# Patient Record
Sex: Male | Born: 1970 | Race: White | Hispanic: No | Marital: Single | Smoking: Never smoker
Health system: Southern US, Community
[De-identification: ages and names within clinical notes are randomized; demographics above are authoritative.]

## PROBLEM LIST (undated history)

## (undated) ENCOUNTER — Ambulatory Visit: Payer: BC Managed Care – PPO | Source: Home / Self Care

## (undated) DIAGNOSIS — Z87442 Personal history of urinary calculi: Secondary | ICD-10-CM

## (undated) DIAGNOSIS — M479 Spondylosis, unspecified: Secondary | ICD-10-CM

## (undated) HISTORY — PX: MOUTH SURGERY: SHX715

## (undated) HISTORY — PX: FRACTURE SURGERY: SHX138

## (undated) HISTORY — PX: LASIK: SHX215

## (undated) HISTORY — PX: WRIST SURGERY: SHX841

## (undated) HISTORY — PX: FOOT OSTEOTOMY: SHX957

---

## 2000-09-21 ENCOUNTER — Encounter: Payer: Self-pay | Admitting: Family Medicine

## 2000-09-21 ENCOUNTER — Encounter: Admission: RE | Admit: 2000-09-21 | Discharge: 2000-09-21 | Payer: Self-pay | Admitting: Family Medicine

## 2004-04-26 ENCOUNTER — Ambulatory Visit (HOSPITAL_COMMUNITY): Admission: RE | Admit: 2004-04-26 | Discharge: 2004-04-26 | Payer: Self-pay | Admitting: Rheumatology

## 2005-03-07 ENCOUNTER — Ambulatory Visit (HOSPITAL_COMMUNITY): Admission: RE | Admit: 2005-03-07 | Discharge: 2005-03-07 | Payer: Self-pay | Admitting: Orthopedic Surgery

## 2005-03-07 ENCOUNTER — Ambulatory Visit (HOSPITAL_BASED_OUTPATIENT_CLINIC_OR_DEPARTMENT_OTHER): Admission: RE | Admit: 2005-03-07 | Discharge: 2005-03-07 | Payer: Self-pay | Admitting: Orthopedic Surgery

## 2006-02-03 ENCOUNTER — Emergency Department (HOSPITAL_COMMUNITY): Admission: EM | Admit: 2006-02-03 | Discharge: 2006-02-03 | Payer: Self-pay | Admitting: *Deleted

## 2006-02-05 ENCOUNTER — Encounter: Admission: RE | Admit: 2006-02-05 | Discharge: 2006-02-05 | Payer: Self-pay | Admitting: Specialist

## 2006-02-08 ENCOUNTER — Ambulatory Visit (HOSPITAL_COMMUNITY): Admission: RE | Admit: 2006-02-08 | Discharge: 2006-02-09 | Payer: Self-pay | Admitting: Specialist

## 2006-07-13 ENCOUNTER — Encounter: Admission: RE | Admit: 2006-07-13 | Discharge: 2006-07-13 | Payer: Self-pay | Admitting: Family Medicine

## 2012-03-19 ENCOUNTER — Other Ambulatory Visit (HOSPITAL_COMMUNITY): Payer: Self-pay | Admitting: Rheumatology

## 2012-03-19 ENCOUNTER — Ambulatory Visit (HOSPITAL_COMMUNITY)
Admission: RE | Admit: 2012-03-19 | Discharge: 2012-03-19 | Disposition: A | Discharge status: Home / Self Care | Payer: Managed Care, Other (non HMO) | Source: Ambulatory Visit | Attending: Rheumatology | Admitting: Rheumatology

## 2012-03-19 DIAGNOSIS — R05 Cough: Secondary | ICD-10-CM | POA: Insufficient documentation

## 2012-03-19 DIAGNOSIS — R0602 Shortness of breath: Secondary | ICD-10-CM | POA: Insufficient documentation

## 2012-03-19 DIAGNOSIS — R059 Cough, unspecified: Secondary | ICD-10-CM | POA: Insufficient documentation

## 2012-03-19 DIAGNOSIS — R52 Pain, unspecified: Secondary | ICD-10-CM

## 2013-12-19 ENCOUNTER — Other Ambulatory Visit (HOSPITAL_COMMUNITY): Payer: Self-pay | Admitting: Orthopaedic Surgery

## 2014-01-02 NOTE — Pre-Procedure Instructions (Addendum)
Edwin BowensJason K Gilbert  01/02/2014   Your procedure is scheduled on:  01/12/14  [facility] at 530 AM.  Call this number if you have problems the morning of surgery: [phone]   Remember:   Do not eat food or drink liquids after midnight.   Take these medicines the morning of surgery with A SIP OF WATER: none      .STOP all herbel meds, nsaids (aleve,naproxen,advil,ibuprofen) 5 days prior to surgery including vitamins, aspirin   Do not wear jewelry, make-up or nail polish.  Do not wear lotions, powders, or perfumes. You may wear deodorant.  Do not shave 48 hours prior to surgery. Men may shave face and neck.  Do not bring valuables to the hospital.  [facility] not responsible                  for any belongings or valuables.               Contacts, dentures or bridgework may not be worn into surgery.  Leave suitcase in the car. After surgery it may be brought to your room.  For patients admitted to the hospital, discharge time is determined by your                treatment team.               Patients discharged the day of surgery will not be allowed to drive  home.  Name and phone number of your driver:   Special Instructions:  Special Instructions: [facility] - Preparing for Surgery  Before surgery, you can play an important role.  Because skin is not sterile, your skin needs to be as free of germs as possible.  You can reduce the number of germs on you skin by washing with CHG (chlorahexidine gluconate) soap before surgery.  CHG is an antiseptic cleaner which kills germs and bonds with the skin to continue killing germs even after washing.  Please DO NOT use if you have an allergy to CHG or antibacterial soaps.  If your skin becomes reddened/irritated stop using the CHG and inform your nurse when you arrive at Short Stay.  Do not shave (including legs and underarms) for at least 48 hours prior to the first CHG shower.  You may shave your face.  Please  follow these instructions carefully:   1.  Shower with CHG Soap the night before surgery and the morning of Surgery.  2.  If you choose to wash your hair, wash your hair first as usual with your normal shampoo.  3.  After you shampoo, rinse your hair and body thoroughly to remove the Shampoo.  4.  Use CHG as you would any other liquid soap.  You can apply chg directly  to the skin and wash gently with scrungie or a clean washcloth.  5.  Apply the CHG Soap to your body ONLY FROM THE NECK DOWN.  Do not use on open wounds or open sores.  Avoid contact with your eyes ears, mouth and genitals (private parts).  Wash genitals (private parts)       with your normal soap.  6.  Wash thoroughly, paying special attention to the area where your surgery will be performed.  7.  Thoroughly rinse your body with warm water from the neck down.  8.  DO NOT shower/wash with your normal soap after using and rinsing off the CHG Soap.  9.  Edwin BiblePat  yourself dry with a clean towel.            10.  Wear clean pajamas.            11.  Place clean sheets on your bed the night of your first shower and do not sleep with pets.  Day of Surgery  Do not apply any lotions/deodorants the morning of surgery.  Please wear clean clothes to the hospital/surgery center.   Please read over the following fact sheets that you were given: Pain Booklet, Coughing and Deep Breathing and Surgical Site Infection Prevention

## 2014-01-05 ENCOUNTER — Encounter (HOSPITAL_COMMUNITY)
Admission: RE | Admit: 2014-01-05 | Discharge: 2014-01-05 | Disposition: A | Discharge status: Home / Self Care | Payer: BC Managed Care – PPO | Source: Ambulatory Visit | Attending: Orthopaedic Surgery | Admitting: Orthopaedic Surgery

## 2014-01-05 ENCOUNTER — Encounter (HOSPITAL_COMMUNITY): Payer: Self-pay

## 2014-01-05 DIAGNOSIS — Z01812 Encounter for preprocedural laboratory examination: Secondary | ICD-10-CM | POA: Insufficient documentation

## 2014-01-05 HISTORY — DX: Spondylosis, unspecified: M47.9

## 2014-01-05 HISTORY — DX: Personal history of urinary calculi: Z87.442

## 2014-01-05 LAB — CBC
HEMATOCRIT: 40.7 % (ref 39.0–52.0)
Hemoglobin: 14.1 g/dL (ref 13.0–17.0)
MCH: 31.5 pg (ref 26.0–34.0)
MCHC: 34.6 g/dL (ref 30.0–36.0)
MCV: 91.1 fL (ref 78.0–100.0)
Platelets: 174 10*3/uL (ref 150–400)
RBC: 4.47 MIL/uL (ref 4.22–5.81)
RDW: 12.4 % (ref 11.5–15.5)
WBC: 4.3 10*3/uL (ref 4.0–10.5)

## 2014-01-05 LAB — BASIC METABOLIC PANEL
BUN: 17 mg/dL (ref 6–23)
CO2: 26 mEq/L (ref 19–32)
CREATININE: 0.91 mg/dL (ref 0.50–1.35)
Calcium: 9 mg/dL (ref 8.4–10.5)
Chloride: 105 mEq/L (ref 96–112)
GFR calc Af Amer: >90 mL/min (ref >90)
Glucose, Bld: 91 mg/dL (ref 70–99)
Potassium: 4.2 mEq/L (ref 3.7–5.3)
Sodium: 143 mEq/L (ref 137–147)

## 2014-01-11 MED ORDER — CEFAZOLIN SODIUM-DEXTROSE 2-3 GM-% IV SOLR
2.0000 g | INTRAVENOUS | Status: AC
Start: 2014-01-12 — End: 2014-01-12
  Administered 2014-01-12: 2 g via INTRAVENOUS

## 2014-01-12 ENCOUNTER — Encounter (HOSPITAL_COMMUNITY): Payer: BC Managed Care – PPO | Admitting: Certified Registered"

## 2014-01-12 ENCOUNTER — Encounter (HOSPITAL_COMMUNITY): Payer: Self-pay | Admitting: *Deleted

## 2014-01-12 ENCOUNTER — Encounter (HOSPITAL_COMMUNITY)
Admission: RE | Disposition: A | Discharge status: Home / Self Care | Payer: Self-pay | Source: Ambulatory Visit | Attending: Orthopaedic Surgery

## 2014-01-12 ENCOUNTER — Ambulatory Visit (HOSPITAL_COMMUNITY): Payer: BC Managed Care – PPO

## 2014-01-12 ENCOUNTER — Ambulatory Visit (HOSPITAL_COMMUNITY)
Admission: RE | Admit: 2014-01-12 | Discharge: 2014-01-12 | Disposition: A | Discharge status: Home / Self Care | Payer: BC Managed Care – PPO | Source: Ambulatory Visit | Attending: Orthopaedic Surgery | Admitting: Orthopaedic Surgery

## 2014-01-12 ENCOUNTER — Ambulatory Visit (HOSPITAL_COMMUNITY): Payer: BC Managed Care – PPO | Admitting: Certified Registered"

## 2014-01-12 DIAGNOSIS — M479 Spondylosis, unspecified: Secondary | ICD-10-CM | POA: Insufficient documentation

## 2014-01-12 DIAGNOSIS — S42009K Fracture of unspecified part of unspecified clavicle, subsequent encounter for fracture with nonunion: Secondary | ICD-10-CM | POA: Diagnosis present

## 2014-01-12 DIAGNOSIS — IMO0002 Reserved for concepts with insufficient information to code with codable children: Secondary | ICD-10-CM | POA: Insufficient documentation

## 2014-01-12 HISTORY — PX: ORIF CLAVICULAR FRACTURE: SHX5055

## 2014-01-12 SURGERY — OPEN REDUCTION INTERNAL FIXATION (ORIF) CLAVICULAR FRACTURE
Anesthesia: General | Site: Shoulder | Laterality: Left

## 2014-01-12 MED ORDER — NEOSTIGMINE METHYLSULFATE 1 MG/ML IJ SOLN
INTRAMUSCULAR | Status: AC
  Filled 2014-01-12: qty 10

## 2014-01-12 MED ORDER — LIDOCAINE HCL (CARDIAC) 20 MG/ML IV SOLN
INTRAVENOUS | Status: DC | PRN
  Administered 2014-01-12: 100 mg via INTRAVENOUS

## 2014-01-12 MED ORDER — PROPOFOL 10 MG/ML IV BOLUS
INTRAVENOUS | Status: AC
  Filled 2014-01-12: qty 20

## 2014-01-12 MED ORDER — ONDANSETRON HCL 4 MG/2ML IJ SOLN
INTRAMUSCULAR | Status: DC | PRN
  Administered 2014-01-12: 4 mg via INTRAVENOUS

## 2014-01-12 MED ORDER — NEOSTIGMINE METHYLSULFATE 1 MG/ML IJ SOLN
INTRAMUSCULAR | Status: DC | PRN
  Administered 2014-01-12: 4 mg via INTRAVENOUS

## 2014-01-12 MED ORDER — ROCURONIUM BROMIDE 50 MG/5ML IV SOLN
INTRAVENOUS | Status: AC
  Filled 2014-01-12: qty 1

## 2014-01-12 MED ORDER — LACTATED RINGERS IV SOLN
INTRAVENOUS | Status: DC | PRN
  Administered 2014-01-12 (×2): via INTRAVENOUS

## 2014-01-12 MED ORDER — OXYCODONE HCL 5 MG PO TABS
ORAL_TABLET | ORAL | Status: AC
  Filled 2014-01-12: qty 1

## 2014-01-12 MED ORDER — ARTIFICIAL TEARS OP OINT
TOPICAL_OINTMENT | OPHTHALMIC | Status: DC | PRN
  Administered 2014-01-12: 1 via OPHTHALMIC

## 2014-01-12 MED ORDER — FENTANYL CITRATE 0.05 MG/ML IJ SOLN
INTRAMUSCULAR | Status: AC
  Filled 2014-01-12: qty 5

## 2014-01-12 MED ORDER — LIDOCAINE HCL (CARDIAC) 20 MG/ML IV SOLN
INTRAVENOUS | Status: AC
  Filled 2014-01-12: qty 5

## 2014-01-12 MED ORDER — FENTANYL CITRATE 0.05 MG/ML IJ SOLN
INTRAMUSCULAR | Status: DC | PRN
  Administered 2014-01-12: 100 ug via INTRAVENOUS
  Administered 2014-01-12 (×4): 50 ug via INTRAVENOUS

## 2014-01-12 MED ORDER — MIDAZOLAM HCL 2 MG/2ML IJ SOLN
INTRAMUSCULAR | Status: AC
  Filled 2014-01-12: qty 2

## 2014-01-12 MED ORDER — ROCURONIUM BROMIDE 100 MG/10ML IV SOLN
INTRAVENOUS | Status: DC | PRN
  Administered 2014-01-12: 30 mg via INTRAVENOUS
  Administered 2014-01-12: 10 mg via INTRAVENOUS

## 2014-01-12 MED ORDER — ONDANSETRON HCL 4 MG/2ML IJ SOLN: 4.0000 mg | Freq: Once | INTRAMUSCULAR | Status: DC | PRN

## 2014-01-12 MED ORDER — PROPOFOL 10 MG/ML IV BOLUS
INTRAVENOUS | Status: DC | PRN
  Administered 2014-01-12: 200 mg via INTRAVENOUS

## 2014-01-12 MED ORDER — HYDROMORPHONE HCL PF 1 MG/ML IJ SOLN
0.2500 mg | INTRAMUSCULAR | Status: DC | PRN
  Administered 2014-01-12: 0.5 mg via INTRAVENOUS

## 2014-01-12 MED ORDER — 0.9 % SODIUM CHLORIDE (POUR BTL) OPTIME
TOPICAL | Status: DC | PRN
  Administered 2014-01-12: 1000 mL

## 2014-01-12 MED ORDER — ARTIFICIAL TEARS OP OINT
TOPICAL_OINTMENT | OPHTHALMIC | Status: AC
Start: 2014-01-12 — End: 2014-01-12
  Filled 2014-01-12: qty 3.5

## 2014-01-12 MED ORDER — OXYCODONE HCL 5 MG PO TABS: 5.0000 mg | ORAL_TABLET | ORAL | Status: DC | PRN

## 2014-01-12 MED ORDER — GLYCOPYRROLATE 0.2 MG/ML IJ SOLN
INTRAMUSCULAR | Status: AC
  Filled 2014-01-12: qty 3

## 2014-01-12 MED ORDER — GLYCOPYRROLATE 0.2 MG/ML IJ SOLN
INTRAMUSCULAR | Status: DC | PRN
  Administered 2014-01-12: 0.6 mg via INTRAVENOUS

## 2014-01-12 MED ORDER — ONDANSETRON HCL 4 MG/2ML IJ SOLN
INTRAMUSCULAR | Status: AC
  Filled 2014-01-12: qty 2

## 2014-01-12 MED ORDER — MIDAZOLAM HCL 5 MG/5ML IJ SOLN
INTRAMUSCULAR | Status: DC | PRN
  Administered 2014-01-12: 1 mg via INTRAVENOUS

## 2014-01-12 MED ORDER — OXYCODONE HCL 5 MG PO TABS
5.0000 mg | ORAL_TABLET | Freq: Once | ORAL | Status: AC
  Administered 2014-01-12: 5 mg via ORAL

## 2014-01-12 MED ORDER — HYDROMORPHONE HCL PF 1 MG/ML IJ SOLN
INTRAMUSCULAR | Status: AC
  Filled 2014-01-12: qty 1

## 2014-01-12 SURGICAL SUPPLY — 53 items
BB-TAK ×2 IMPLANT
BIT DRILL 2 CANN GRADUATED (BIT) ×2 IMPLANT
BIT DRILL 2.5 CANN ENDOSCOPIC (BIT) ×2 IMPLANT
BONE MATRIX DEMINERALIZED 1CC (Bone Implant) ×2 IMPLANT
CANISTER SUCTION 2500CC (MISCELLANEOUS) ×2 IMPLANT
CLOTH BEACON ORANGE TIMEOUT ST (SAFETY) ×2 IMPLANT
COVER SURGICAL LIGHT HANDLE (MISCELLANEOUS) ×2 IMPLANT
DRAPE C-ARM 42X72 X-RAY (DRAPES) ×2 IMPLANT
DRAPE INCISE IOBAN 66X45 STRL (DRAPES) ×2 IMPLANT
DRAPE PROXIMA HALF (DRAPES) ×2 IMPLANT
DRAPE SURG 17X23 STRL (DRAPES) ×2 IMPLANT
DRAPE U-SHAPE 47X51 STRL (DRAPES) ×2 IMPLANT
DRSG TEGADERM 4X4.75 (GAUZE/BANDAGES/DRESSINGS) ×6 IMPLANT
DURAPREP 26ML APPLICATOR (WOUND CARE) ×2 IMPLANT
ELECT CAUTERY BLADE 6.4 (BLADE) ×2 IMPLANT
ELECT REM PT RETURN 9FT ADLT (ELECTROSURGICAL) ×2
ELECTRODE REM PT RTRN 9FT ADLT (ELECTROSURGICAL) ×1 IMPLANT
FACESHIELD LNG OPTICON STERILE (SAFETY) IMPLANT
GAUZE XEROFORM 1X8 LF (GAUZE/BANDAGES/DRESSINGS) ×2 IMPLANT
GLOVE SURG SS PI 7.5 STRL IVOR (GLOVE) ×4 IMPLANT
GOWN STRL NON-REIN LRG LVL3 (GOWN DISPOSABLE) ×2 IMPLANT
GOWN STRL REIN XL XLG (GOWN DISPOSABLE) ×2 IMPLANT
KIT BASIN OR (CUSTOM PROCEDURE TRAY) ×2 IMPLANT
KIT ROOM TURNOVER OR (KITS) ×2 IMPLANT
MANIFOLD NEPTUNE II (INSTRUMENTS) IMPLANT
NEEDLE HYPO 25GX1X1/2 BEV (NEEDLE) ×2 IMPLANT
NS IRRIG 1000ML POUR BTL (IV SOLUTION) ×2 IMPLANT
PACK SHOULDER (CUSTOM PROCEDURE TRAY) ×2 IMPLANT
PAD ARMBOARD 7.5X6 YLW CONV (MISCELLANEOUS) ×4 IMPLANT
PLATE 5H DISTAL THIRD (Plate) ×2 IMPLANT
SCREW CANCELLOUS 3MM 3X12MM (Screw) ×2 IMPLANT
SCREW CANCELLOUS 3MM 3X14MM (Screw) ×2 IMPLANT
SCREW CANCELLOUS 3X16MM (Screw) ×4 IMPLANT
SCREW LOCKING 2.7X14MM (Screw) ×4 IMPLANT
SCREW LOCKING 2.7X16MM (Screw) ×4 IMPLANT
SCREW LOW PROFILE 3.5X14 (Screw) ×6 IMPLANT
SLING ARM FOAM STRAP LRG (SOFTGOODS) ×2 IMPLANT
SPONGE GAUZE 4X4 12PLY (GAUZE/BANDAGES/DRESSINGS) ×2 IMPLANT
SPONGE LAP 18X18 X RAY DECT (DISPOSABLE) ×4 IMPLANT
SPONGE LAP 4X18 X RAY DECT (DISPOSABLE) ×4 IMPLANT
STRIP CLOSURE SKIN 1/2X4 (GAUZE/BANDAGES/DRESSINGS) ×2 IMPLANT
SUCTION FRAZIER TIP 10 FR DISP (SUCTIONS) ×4 IMPLANT
SUT ETHILON 3 0 PS 1 (SUTURE) ×4 IMPLANT
SUT MNCRL AB 4-0 PS2 18 (SUTURE) ×2 IMPLANT
SUT PROLENE 3 0 PS 1 (SUTURE) IMPLANT
SUT VIC AB 0 CT1 27 (SUTURE) ×1
SUT VIC AB 0 CT1 27XBRD ANBCTR (SUTURE) ×1 IMPLANT
SUT VIC AB 2-0 CT1 27 (SUTURE) ×1
SUT VIC AB 2-0 CT1 TAPERPNT 27 (SUTURE) ×1 IMPLANT
SUT VICRYL 0 CT 1 36IN (SUTURE) ×2 IMPLANT
SYR CONTROL 10ML LL (SYRINGE) IMPLANT
WATER STERILE IRR 1000ML POUR (IV SOLUTION) IMPLANT
YANKAUER SUCT BULB TIP NO VENT (SUCTIONS) ×2 IMPLANT
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
## 2014-01-12 NOTE — H&P (Signed)
PREOPERATIVE H&P  Chief Complaint: Left clavicle non-union  HPI: Edwin Gilbert is a 43 y.o. male who presents for surgical treatment of Left clavicle non-union.  He denies any changes in medical history.  Past Medical History  Diagnosis Date  . History of kidney stones   . Spondylosis    Past Surgical History  Procedure Laterality Date  . Foot osteotomy Left     hammer toe  . Mouth surgery      tooth  . Wrist surgery Right     fx  . Lasik     History   Social History  . Marital Status: Married    Spouse Name: N/A    Number of Children: N/A  . Years of Education: N/A   Social History Main Topics  . Smoking status: Never Smoker   . Smokeless tobacco: None     Comment: occ alcohol  . Alcohol Use: Yes  . Drug Use: None  . Sexual Activity: None   Other Topics Concern  . None   Social History Narrative  . None   History reviewed. No pertinent family history. Allergies  Allergen Reactions  . Lactose Intolerance (Gi) Diarrhea   Prior to Admission medications   Medication Sig Start Date End Date Taking? Authorizing Provider  Etanercept (ENBREL [place]) Inject into the skin once a week. On sunday   Yes Historical Provider, MD     Positive ROS: All other systems have been reviewed and were otherwise negative with the exception of those mentioned in the HPI and as above.  Physical Exam: General: Alert, no acute distress Cardiovascular: No pedal edema Respiratory: No cyanosis, no use of accessory musculature GI: No organomegaly, abdomen is soft and non-tender Skin: No lesions in the area of chief complaint Neurologic: Sensation intact distally Psychiatric: Patient is competent for consent with normal mood and affect Lymphatic: No axillary or cervical lymphadenopathy  MUSCULOSKELETAL:   LUE exam stable  Assessment: Left clavicle non-union  Plan: Plan for Procedure(s): OPEN REDUCTION INTERNAL FIXATION (ORIF) LEFT CLAVICLE WITH BONE GRAFTING  The risks benefits  and alternatives were discussed with the patient including but not limited to the risks of nonoperative treatment, versus surgical intervention including infection, bleeding, nerve injury,  blood clots, cardiopulmonary complications, morbidity, mortality, among others, and they were willing to proceed.   Cheral AlmasXu, Ashad Fawbush Michael, MD   01/12/2014 6:03 AM

## 2014-01-12 NOTE — Anesthesia Postprocedure Evaluation (Signed)
  Anesthesia Post-op Note  Patient: Edwin BowensJason K Casebeer  Procedure(s) Performed: Procedure(s): OPEN REDUCTION INTERNAL FIXATION (ORIF) LEFT CLAVICLE WITH BONE GRAFTING (Left)  Patient Location: PACU  Anesthesia Type:General  Level of Consciousness: awake, alert , oriented and patient cooperative  Airway and Oxygen Therapy: Patient Spontanous Breathing  Post-op Pain: mild  Post-op Assessment: Post-op Vital signs reviewed, Patient's Cardiovascular Status Stable, Respiratory Function Stable, Patent Airway, No signs of Nausea or vomiting and Pain level controlled  Post-op Vital Signs: stable  Complications: No apparent anesthesia complications

## 2014-01-12 NOTE — Anesthesia Procedure Notes (Signed)
Procedure Name: Intubation Date/Time: 01/12/2014 7:34 AM Performed by: Lanell MatarBAKER, Jamacia Jester M Pre-anesthesia Checklist: Patient identified, Timeout performed, Emergency Drugs available, Suction available and Patient being monitored Patient Re-evaluated:Patient Re-evaluated prior to inductionOxygen Delivery Method: Circle system utilized Preoxygenation: Pre-oxygenation with 100% oxygen Intubation Type: IV induction Ventilation: Mask ventilation without difficulty and Oral airway inserted - appropriate to patient size Laryngoscope Size: Hyacinth MeekerMiller and 2 Grade View: Grade I Tube type: Oral Number of attempts: 1 Airway Equipment and Method: Stylet Placement Confirmation: ETT inserted through vocal cords under direct vision,  breath sounds checked- equal and bilateral,  positive ETCO2 and CO2 detector Secured at: 22 cm Tube secured with: Tape Dental Injury: Teeth and Oropharynx as per pre-operative assessment

## 2014-01-12 NOTE — Brief Op Note (Signed)
   Brief Op Note  Date of Surgery: 01/12/2014  Preoperative Diagnosis: Left clavicle non-union  Postoperative Diagnosis: same  Procedure: Procedure(s): OPEN REDUCTION INTERNAL FIXATION (ORIF) LEFT CLAVICLE WITH BONE GRAFTING  Implants: Arthrex distal clavicle plate  Surgeons: Surgeon(s): Naiping Glee ArvinMichael Xu, MD  Anesthesia: General  Drains: none  Estimated Blood Loss: See anesthesia record  Complications: None  Condition to PACU: Stable  Naiping Glee ArvinMichael Xu, MD [facility] 01/12/2014 9:27 AM

## 2014-01-12 NOTE — Transfer of Care (Signed)
Immediate Anesthesia Transfer of Care Note  Patient: Edwin Gilbert  Procedure(s) Performed: Procedure(s): OPEN REDUCTION INTERNAL FIXATION (ORIF) LEFT CLAVICLE WITH BONE GRAFTING (Left)  Patient Location: PACU  Anesthesia Type:General  Level of Consciousness: awake, alert  and oriented  Airway & Oxygen Therapy: Patient Spontanous Breathing and Patient connected to nasal cannula oxygen  Post-op Assessment: Report given to PACU RN, Post -op Vital signs reviewed and stable and Patient moving all extremities X 4  Post vital signs: Reviewed and stable  Complications: No apparent anesthesia complications

## 2014-01-12 NOTE — Discharge Instructions (Signed)
1. Remain in sling at all times 2. Strict non weight bearing to left upper extremity 3. Resume enbrel in 2 weeks 4. May change surgical dressing in 2 days and then place gauze with paper tape on incision 5. May get incision wet with shower in 10 days  What to eat:  For your first meals, you should eat lightly; only small meals initially.  If you do not have nausea, you may eat larger meals.  Avoid spicy, greasy and heavy food.    General Anesthesia, Adult, Care After  Refer to this sheet in the next few weeks. These instructions provide you with information on caring for yourself after your procedure. Your health care provider may also give you more specific instructions. Your treatment has been planned according to current medical practices, but problems sometimes occur. Call your health care provider if you have any problems or questions after your procedure.  WHAT TO EXPECT AFTER THE PROCEDURE  After the procedure, it is typical to experience:  Sleepiness.  Nausea and vomiting. HOME CARE INSTRUCTIONS  For the first 24 hours after general anesthesia:  Have a responsible person with you.  Do not drive a car. If you are alone, do not take public transportation.  Do not drink alcohol.  Do not take medicine that has not been prescribed by your health care provider.  Do not sign important papers or make important decisions.  You may resume a normal diet and activities as directed by your health care provider.  Change bandages (dressings) as directed.  If you have questions or problems that seem related to general anesthesia, call the hospital and ask for the anesthetist or anesthesiologist on call. SEEK MEDICAL CARE IF:  You have nausea and vomiting that continue the day after anesthesia.  You develop a rash. SEEK IMMEDIATE MEDICAL CARE IF:  You have difficulty breathing.  You have chest pain.  You have any allergic problems. Document Released: 02/19/2001 Document Revised: 07/16/2013  Document Reviewed: 05/29/2013  [facility] Patient Information 2014 [employer] [place].

## 2014-01-12 NOTE — Anesthesia Preprocedure Evaluation (Addendum)
Anesthesia Evaluation  Patient identified by MRN, date of birth, ID band Patient awake    Reviewed: Allergy & Precautions, H&P , NPO status , Patient's Chart, lab work & pertinent test results  Airway Mallampati: I TM Distance: >3 FB Neck ROM: Full    Dental  (+) Teeth Intact, Dental Advisory Given, Chipped,    Pulmonary          Cardiovascular     Neuro/Psych    GI/Hepatic   Endo/Other    Renal/GU Renal disease     Musculoskeletal   Abdominal   Peds  Hematology   Anesthesia Other Findings   Reproductive/Obstetrics                          Anesthesia Physical Anesthesia Plan  ASA: I  Anesthesia Plan: General   Post-op Pain Management:    Induction: Intravenous  Airway Management Planned: Oral ETT  Additional Equipment:   Intra-op Plan:   Post-operative Plan: Extubation in OR  Informed Consent: I have reviewed the patients History and Physical, chart, labs and discussed the procedure including the risks, benefits and alternatives for the proposed anesthesia with the patient or authorized representative who has indicated his/her understanding and acceptance.     Plan Discussed with:   Anesthesia Plan Comments:         Anesthesia Quick Evaluation

## 2014-01-20 ENCOUNTER — Encounter (HOSPITAL_COMMUNITY): Payer: Self-pay | Admitting: Orthopaedic Surgery

## 2014-03-20 ENCOUNTER — Ambulatory Visit (INDEPENDENT_AMBULATORY_CARE_PROVIDER_SITE_OTHER): Payer: BC Managed Care – PPO | Admitting: Family Medicine

## 2014-03-20 ENCOUNTER — Ambulatory Visit: Payer: BC Managed Care – PPO

## 2014-03-20 VITALS — BP 122/64 | HR 70 | Temp 98.1°F | Resp 16 | Ht 69.0 in | Wt 162.6 lb

## 2014-03-20 DIAGNOSIS — R6889 Other general symptoms and signs: Secondary | ICD-10-CM

## 2014-03-20 DIAGNOSIS — R0989 Other specified symptoms and signs involving the circulatory and respiratory systems: Secondary | ICD-10-CM

## 2014-03-20 NOTE — Progress Notes (Signed)
This chart was scribed for Meredith StaggersJeffrey Rianna Lukes by Tana ConchStephen Methvin, ED Scribe. This patient was seen in room 8 and the patient's care was started at 4:28 PM .  Subjective:    Patient ID: Edwin Gilbert, male    DOB: 02/01/1971, 43 y.o.   MRN: [id number]  HPI   HPI Comments: Edwin Gilbert is a 43 y.o. male who presents to the [facility] complaining of foreign body in his throat, he was eating fish a few weeks ago and felt something get stuck. He decided to wait and see if it worked its way out and it has not really bothered him until today. Today while he was eating lunch, he felt something sharp in his throat . He denies difficulty eating, difficulty breathing, and difficulty sleeping. He also denies fever.  He reports that it was a bass.       Patient Active Problem List   Diagnosis Date Noted  . Fracture of clavicle with nonunion 01/12/2014   Past Medical History  Diagnosis Date  . History of kidney stones   . Spondylosis    Past Surgical History  Procedure Laterality Date  . Foot osteotomy Left     hammer toe  . Mouth surgery      tooth  . Wrist surgery Right     fx  . Lasik    . Orif clavicular fracture Left 01/12/2014    Procedure: OPEN REDUCTION INTERNAL FIXATION (ORIF) LEFT CLAVICLE WITH BONE GRAFTING;  Surgeon: Cheral AlmasNaiping Michael Xu, MD;  Location: [facility] OR;  Service: Orthopedics;  Laterality: Left;   Allergies  Allergen Reactions  . Lactose Intolerance (Gi) Diarrhea   Prior to Admission medications   Medication Sig Start Date End Date Taking? Authorizing Provider  Etanercept (ENBREL [place]) Inject into the skin once a week. On sunday   Yes Historical Provider, MD  oxyCODONE (OXY IR/ROXICODONE) 5 MG immediate release tablet Take 1-3 tablets (5-15 mg total) by mouth every 4 (four) hours as needed. 01/12/14   Naiping Glee ArvinMichael Xu, MD   History   Social History  . Marital Status: Married    Spouse Name: N/A    Number of Children: N/A  . Years of Education: N/A     Occupational History  . Not on file.   Social History Main Topics  . Smoking status: Never Smoker   . Smokeless tobacco: Not on file     Comment: occ alcohol  . Alcohol Use: Yes  . Drug Use: Not on file  . Sexual Activity: Not on file   Other Topics Concern  . Not on file   Social History Narrative  . No narrative on file     Review of Systems     Objective:   Physical Exam  Nursing note and vitals reviewed. Constitutional: He is oriented to person, place, and time. He appears well-developed and well-nourished.  HENT:  Head: Normocephalic and atraumatic.  Right Ear: Tympanic membrane, external ear and ear canal normal.  Left Ear: Tympanic membrane, external ear and ear canal normal.  Nose: No rhinorrhea.  Mouth/Throat: Oropharynx is clear and moist and mucous membranes are normal. No oropharyngeal exudate or posterior oropharyngeal erythema.  No erythema, no foreign body in throat visualized  Eyes: Conjunctivae are normal. Pupils are equal, round, and reactive to light.  Neck: Neck supple.  Cardiovascular: Normal rate, regular rhythm, normal heart sounds and intact distal pulses.   No murmur heard. Pulmonary/Chest: Effort normal and breath sounds normal.  He has no wheezes. He has no rhonchi. He has no rales.  Abdominal: Soft. There is no tenderness.  Lymphadenopathy:    He has no cervical adenopathy.  Neurological: He is alert and oriented to person, place, and time.  Skin: Skin is warm and dry. No rash noted.  Psychiatric: He has a normal mood and affect. His behavior is normal.     Filed Vitals:   03/20/14 1542  BP: 122/64  Pulse: 70  Temp: 98.1 F (36.7 C)  Resp: 16  Height: 5\' 9"  (1.753 m)  Weight: 162 lb 9.6 oz (73.755 kg)  SpO2: 100%   [facility] reading (PRIMARY) by  Dr. Neva SeatGreene: no apparent FB.     Assessment & Plan:  Edwin Gilbert is a 43 y.o. male Foreign body sensation in throat - Plan: DG Neck Soft Tissue, Ambulatory referral to ENT No apparent  FB on XR but with persistent intermittent FB sensation, will refer to ENT for eval and possible laryngoscopy? RTC/ER precautions if worsening sx's.    No orders of the defined types were placed in this encounter.   Patient Instructions  We will refer you to ENT next week to possibly look at area with camera. Return to the clinic or go to the nearest emergency room if any of your symptoms worsen or new symptoms occur.    I personally performed the services described in this documentation, which was scribed in my presence. The recorded information has been reviewed and considered, and addended by me as needed.

## 2014-03-20 NOTE — Patient Instructions (Signed)
We will refer you to ENT next week to possibly look at area with camera. Return to the clinic or go to the nearest emergency room if any of your symptoms worsen or new symptoms occur.

## 2014-10-08 ENCOUNTER — Ambulatory Visit (HOSPITAL_COMMUNITY)
Admission: RE | Admit: 2014-10-08 | Discharge: 2014-10-08 | Disposition: A | Discharge status: Home / Self Care | Payer: BC Managed Care – PPO | Source: Ambulatory Visit | Attending: Internal Medicine | Admitting: Internal Medicine

## 2014-10-08 ENCOUNTER — Other Ambulatory Visit (HOSPITAL_COMMUNITY): Payer: Self-pay | Admitting: Internal Medicine

## 2014-10-08 DIAGNOSIS — N200 Calculus of kidney: Secondary | ICD-10-CM

## 2015-01-24 ENCOUNTER — Emergency Department (HOSPITAL_COMMUNITY): Payer: BLUE CROSS/BLUE SHIELD

## 2015-01-24 ENCOUNTER — Encounter (HOSPITAL_COMMUNITY): Payer: Self-pay

## 2015-01-24 ENCOUNTER — Emergency Department (HOSPITAL_COMMUNITY)
Admission: EM | Admit: 2015-01-24 | Discharge: 2015-01-24 | Disposition: A | Discharge status: Home / Self Care | Payer: BLUE CROSS/BLUE SHIELD | Attending: Emergency Medicine | Admitting: Emergency Medicine

## 2015-01-24 DIAGNOSIS — S42212A Unspecified displaced fracture of surgical neck of left humerus, initial encounter for closed fracture: Secondary | ICD-10-CM | POA: Insufficient documentation

## 2015-01-24 DIAGNOSIS — S8991XA Unspecified injury of right lower leg, initial encounter: Secondary | ICD-10-CM | POA: Insufficient documentation

## 2015-01-24 DIAGNOSIS — T07XXXA Unspecified multiple injuries, initial encounter: Secondary | ICD-10-CM

## 2015-01-24 DIAGNOSIS — S50311A Abrasion of right elbow, initial encounter: Secondary | ICD-10-CM | POA: Insufficient documentation

## 2015-01-24 DIAGNOSIS — S50312A Abrasion of left elbow, initial encounter: Secondary | ICD-10-CM | POA: Insufficient documentation

## 2015-01-24 DIAGNOSIS — Z87442 Personal history of urinary calculi: Secondary | ICD-10-CM | POA: Diagnosis not present

## 2015-01-24 DIAGNOSIS — S50812A Abrasion of left forearm, initial encounter: Secondary | ICD-10-CM | POA: Insufficient documentation

## 2015-01-24 DIAGNOSIS — Y9289 Other specified places as the place of occurrence of the external cause: Secondary | ICD-10-CM | POA: Diagnosis not present

## 2015-01-24 DIAGNOSIS — R200 Anesthesia of skin: Secondary | ICD-10-CM | POA: Insufficient documentation

## 2015-01-24 DIAGNOSIS — Z8739 Personal history of other diseases of the musculoskeletal system and connective tissue: Secondary | ICD-10-CM | POA: Diagnosis not present

## 2015-01-24 DIAGNOSIS — S91312A Laceration without foreign body, left foot, initial encounter: Secondary | ICD-10-CM | POA: Diagnosis not present

## 2015-01-24 DIAGNOSIS — S51012A Laceration without foreign body of left elbow, initial encounter: Secondary | ICD-10-CM | POA: Diagnosis not present

## 2015-01-24 DIAGNOSIS — S42202A Unspecified fracture of upper end of left humerus, initial encounter for closed fracture: Secondary | ICD-10-CM

## 2015-01-24 DIAGNOSIS — R52 Pain, unspecified: Secondary | ICD-10-CM

## 2015-01-24 DIAGNOSIS — S50811A Abrasion of right forearm, initial encounter: Secondary | ICD-10-CM | POA: Insufficient documentation

## 2015-01-24 DIAGNOSIS — Y9389 Activity, other specified: Secondary | ICD-10-CM | POA: Diagnosis not present

## 2015-01-24 DIAGNOSIS — Y998 Other external cause status: Secondary | ICD-10-CM | POA: Diagnosis not present

## 2015-01-24 DIAGNOSIS — S80212A Abrasion, left knee, initial encounter: Secondary | ICD-10-CM | POA: Diagnosis not present

## 2015-01-24 DIAGNOSIS — S4992XA Unspecified injury of left shoulder and upper arm, initial encounter: Secondary | ICD-10-CM | POA: Diagnosis present

## 2015-01-24 DIAGNOSIS — S80211A Abrasion, right knee, initial encounter: Secondary | ICD-10-CM | POA: Insufficient documentation

## 2015-01-24 MED ORDER — SODIUM CHLORIDE 0.9 % IV BOLUS (SEPSIS)
1000.0000 mL | Freq: Once | INTRAVENOUS | Status: AC
  Administered 2015-01-24: 1000 mL via INTRAVENOUS

## 2015-01-24 MED ORDER — FENTANYL CITRATE 0.05 MG/ML IJ SOLN
100.0000 ug | Freq: Once | INTRAMUSCULAR | Status: AC
  Administered 2015-01-24: 100 ug via INTRAVENOUS
  Filled 2015-01-24: qty 2

## 2015-01-24 MED ORDER — LIDOCAINE-EPINEPHRINE 2 %-1:200000 IJ SOLN
10.0000 mL | Freq: Once | INTRAMUSCULAR | Status: AC
Start: 2015-01-24 — End: 2015-01-24
  Administered 2015-01-24: 10 mL
  Filled 2015-01-24: qty 20

## 2015-01-24 MED ORDER — OXYCODONE-ACETAMINOPHEN 5-325 MG PO TABS: 2.0000 | ORAL_TABLET | ORAL | Status: DC | PRN

## 2015-01-24 MED ORDER — MORPHINE SULFATE 4 MG/ML IJ SOLN
4.0000 mg | Freq: Once | INTRAMUSCULAR | Status: AC
  Administered 2015-01-24: 4 mg via INTRAVENOUS
  Filled 2015-01-24: qty 1

## 2015-01-24 NOTE — ED Notes (Signed)
Pt. Left with all belongings 
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
## 2015-01-24 NOTE — Progress Notes (Signed)
Orthopedic Tech Progress Note Patient Details:  Edwin Gilbert 12/01/1970 [id number] Applied shoulder immobilizer to LUE.  Pulses, sensation, motion intact before and after application.  Capillary refill less than 2 seconds before and after application. Ortho Devices Type of Ortho Device: Shoulder immobilizer Ortho Device/Splint Location: LUE Ortho Device/Splint Interventions: Application   Lesle ChrisGilliland, Markelle Asaro L 01/24/2015, 7:59 PM

## 2015-01-24 NOTE — ED Notes (Signed)
Patient transported to X-ray 
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
## 2015-01-24 NOTE — Discharge Instructions (Signed)
Return to the emergency room with worsening of symptoms, new symptoms or with symptoms that are concerning. Go to Dr. Margarita Rana office tomorrow at 8 AM. Read below information and follow recommendations. Percocet for severe pain. Do not operate machinery, drive or drink alcohol while taking narcotics or muscle relaxers.  Keep wound dry and do not remove dressing for 24 hours if possible. After that, wash gently morning and night (every 12 hours) with soap and water. Use a topical antibiotic ointment and cover with a bandaid or gauze.    Do NOT use rubbing alcohol or hydrogen peroxide, do not soak the area   Present to your primary care doctor or the urgent care of your choice, or the ED for suture removal in 7-10 days.   Every attempt was made to remove foreign body (contaminants) from the wound.  However, there is always a chance that some may remain in the wound. This can  increase your risk of infection.   If you see signs of infection (warmth, redness, tenderness, pus, sharp increase in pain, fever, red streaking in the skin) immediately return to the emergency department.   After the wound heals fully, apply sunscreen for 6-12 months to minimize scarring.  Read below information and follow recommendations.  Humerus Fracture, Treated with Immobilization The humerus is the large bone in your upper arm. You have a broken (fractured) humerus. These fractures are easily diagnosed with X-rays. TREATMENT  Simple fractures which will heal without disability are treated with simple immobilization. Immobilization means you will wear a cast, splint, or sling. You have a fracture which will do well with immobilization. The fracture will heal well simply by being held in a good position until it is stable enough to begin range of motion exercises. Do not take part in activities which would further injure your arm.  HOME CARE INSTRUCTIONS   Put ice on the injured area.  Put ice in a plastic  bag.  Place a towel between your skin and the bag.  Leave the ice on for 15-20 minutes, 03-04 times a day.  If you have a cast:  Do not scratch the skin under the cast using sharp or pointed objects.  Check the skin around the cast every day. You may put lotion on any red or sore areas.  Keep your cast dry and clean.  If you have a splint:  Wear the splint as directed.  Keep your splint dry and clean.  You may loosen the elastic around the splint if your fingers become numb, tingle, or turn cold or blue.  If you have a sling:  Wear the sling as directed.  Do not put pressure on any part of your cast or splint until it is fully hardened.  Your cast or splint can be protected during bathing with a plastic bag. Do not lower the cast or splint into water.  Only take over-the-counter or prescription medicines for pain, discomfort, or fever as directed by your caregiver.  Do range of motion exercises as instructed by your caregiver.  Follow up as directed by your caregiver. This is very important in order to avoid permanent injury or disability and chronic pain. SEEK IMMEDIATE MEDICAL CARE IF:   Your skin or nails in the injured arm turn blue or gray.  Your arm feels cold or numb.  You develop severe pain in the injured arm.  You are having problems with the medicines you were given. MAKE SURE YOU:   Understand these  instructions.  Will watch your condition.  Will get help right away if you are not doing well or get worse. Document Released: 02/19/2001 Document Revised: 02/05/2012 Document Reviewed: 12/28/2010 [facility] Patient Information 2015 [place], [place]. This information is not intended to replace advice given to you by your health care provider. Make sure you discuss any questions you have with your health care provider.  Sling Use After Injury or Surgery You have been put in a sling today because of an injury or following surgery. If you have a tendon or bone  injury it may take up to 6 weeks to heal. Use the sling as directed until your caregiver says it is no longer needed. The sling protects and keeps you from using the injured part. Hanging your arm in a sling will give rest and support to the injured part. This also helps with comfort and healing. Slings are used for injuries made worse or more painful by movement. Examples include:  Broken arms.  Broken collarbones.  Shoulder injuries.  Following surgery. The sling should fit comfortably, with your elbow at one end of the sling and your hand at the other end. Your elbow is bent 90 degrees lying across your waist and rests in the sling with your thumb pointing up. Make sure that the hand of the injured arm does not droop down. That could stretch some nerves in the wrist. Your hand should be slightly higher than your elbow. You may also pad the sling behind your neck with some cloth or foam rubber.  A swathe may also be used if it is necessary to keep you from lifting your injured arm. A swathe is a wrap or ace bandage that goes around your chest over your injured arm.  To take the weight off your neck, some slings have a strap that goes around your neck and down your back. One strap is connected to the closed elbow side of the sling with the other end of the strap attached to the wrist side. With a sling like this, your injured shoulder, arm, wrist, or hand is in the sling, the weight is more on your shoulder and back. This is different from the illustration where the sling is supported only by the neck.  In an emergency, a sling can be as simple as a belt or towel tied around your neck to hold your forearm.  HOME CARE INSTRUCTIONS   Do not use your shoulder until instructed to by your caregiver.  If you have been prescribed physical therapy, keep appointments as directed.  For the first couple days following your injury and during times when you are sore, you may use ice on the injured area for  15-20 minutes 03-04 times per day while awake. Put the ice in a plastic bag and place a towel between the bag of ice and your skin. This will help keep the swelling down.  If there is numbness in the fifth finger and ring fingers you may need to pad the elbow to relieve pressure on the ulnar nerve (the crazy bone).  Keep your arm on your chest when lying down.  If a plaster splint was applied, wear the splint until you are seen for a follow-up examination. Rest it on nothing harder than a pillow the first 24 hours. Do not get it wet. You may take it off to take a shower or bath unless instructed otherwise by your caregiver.  You may have been given an elastic bandage to use  with the plaster splint or alone. The splint is too tight if you have numbness, tingling, or if your hand becomes cold and blue. Adjust or reapply the bandage to make it comfortable.  Only take over-the-counter or prescription medicines for pain, discomfort, or fever as directed by your caregiver.  If range of motion exercises are permitted by your caregiver, do not go over the limits suggested. If you have increased pain from doing gentle exercises, stop the exercises until you see your caregiver again.  The length of time needed for healing depends on what your injury or surgery was. SEEK IMMEDIATE MEDICAL CARE IF:   You have an increase in bruising, swelling or pain in the area of your injury or surgery.  You notice a blue color of or coldness in your fingers.  Pain relief is not obtained with medications or any of your problems are getting worse. Document Released: 06/27/2004 Document Revised: 10/30/2012 Document Reviewed: 09/28/2007 [facility] Patient Information 2015 [place], [place]. This information is not intended to replace advice given to you by your health care provider. Make sure you discuss any questions you have with your health care provider.
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
## 2015-12-15 ENCOUNTER — Other Ambulatory Visit: Payer: Self-pay | Admitting: Nurse Practitioner

## 2015-12-15 ENCOUNTER — Ambulatory Visit
Admission: RE | Admit: 2015-12-15 | Discharge: 2015-12-15 | Disposition: A | Discharge status: Home / Self Care | Payer: BLUE CROSS/BLUE SHIELD | Source: Ambulatory Visit | Attending: Nurse Practitioner | Admitting: Nurse Practitioner

## 2015-12-15 DIAGNOSIS — R109 Unspecified abdominal pain: Principal | ICD-10-CM

## 2015-12-15 DIAGNOSIS — G8929 Other chronic pain: Secondary | ICD-10-CM

## 2015-12-15 DIAGNOSIS — R6883 Chills (without fever): Secondary | ICD-10-CM

## 2016-03-28 DIAGNOSIS — M450 Ankylosing spondylitis of multiple sites in spine: Secondary | ICD-10-CM | POA: Diagnosis not present

## 2016-11-02 DIAGNOSIS — Z79899 Other long term (current) drug therapy: Secondary | ICD-10-CM | POA: Diagnosis not present

## 2016-11-02 DIAGNOSIS — M450 Ankylosing spondylitis of multiple sites in spine: Secondary | ICD-10-CM | POA: Diagnosis not present

## 2016-11-02 DIAGNOSIS — R5383 Other fatigue: Secondary | ICD-10-CM | POA: Diagnosis not present

## 2017-01-02 DIAGNOSIS — K1379 Other lesions of oral mucosa: Secondary | ICD-10-CM | POA: Diagnosis not present

## 2017-02-07 DIAGNOSIS — M450 Ankylosing spondylitis of multiple sites in spine: Secondary | ICD-10-CM | POA: Diagnosis not present

## 2017-02-07 DIAGNOSIS — Z79899 Other long term (current) drug therapy: Secondary | ICD-10-CM | POA: Diagnosis not present

## 2017-03-16 DIAGNOSIS — B070 Plantar wart: Secondary | ICD-10-CM | POA: Diagnosis not present

## 2017-03-16 DIAGNOSIS — D225 Melanocytic nevi of trunk: Secondary | ICD-10-CM | POA: Diagnosis not present

## 2017-04-18 ENCOUNTER — Ambulatory Visit (INDEPENDENT_AMBULATORY_CARE_PROVIDER_SITE_OTHER): Payer: BLUE CROSS/BLUE SHIELD | Admitting: Emergency Medicine

## 2017-04-18 ENCOUNTER — Encounter: Payer: Self-pay | Admitting: Emergency Medicine

## 2017-04-18 VITALS — BP 122/74 | HR 54 | Temp 97.9°F | Resp 18 | Ht 69.92 in | Wt 154.4 lb

## 2017-04-18 DIAGNOSIS — Z0000 Encounter for general adult medical examination without abnormal findings: Secondary | ICD-10-CM

## 2017-04-18 NOTE — Patient Instructions (Addendum)
   IF you received an x-ray today, you will receive an invoice from [facility]. Please contact [facility] at [phone] with questions or concerns regarding your invoice.   IF you received labwork today, you will receive an invoice from [facility]. Please contact [facility] at [phone] with questions or concerns regarding your invoice.   Our billing staff will not be able to assist you with questions regarding bills from these companies.  You will be contacted with the lab results as soon as they are available. The fastest way to get your results is to activate your My Chart account. Instructions are located on the last page of this paperwork. If you have not heard from us regarding the results in 2 weeks, please contact this office.        Health Maintenance, Male A healthy lifestyle and preventive care is important for your health and wellness. Ask your health care provider about what schedule of regular examinations is right for you. What should I know about weight and diet?  Eat a Healthy Diet  Eat plenty of vegetables, fruits, whole grains, low-fat dairy products, and lean protein.  Do not eat a lot of foods high in solid fats, added sugars, or salt. Maintain a Healthy Weight  Regular exercise can help you achieve or maintain a healthy weight. You should:  Do at least 150 minutes of exercise each week. The exercise should increase your heart rate and make you sweat (moderate-intensity exercise).  Do strength-training exercises at least twice a week. Watch Your Levels of Cholesterol and Blood Lipids  Have your blood tested for lipids and cholesterol every 5 years starting at 45 years of age. If you are at high risk for heart disease, you should start having your blood tested when you are 45 years old. You may need to have your cholesterol levels checked more often if:  Your lipid or cholesterol levels are high.  You are older than 45 years of  age.  You are at high risk for heart disease. What should I know about cancer screening? Many types of cancers can be detected early and may often be prevented. Lung Cancer  You should be screened every year for lung cancer if:  You are a current smoker who has smoked for at least 30 years.  You are a former smoker who has quit within the past 15 years.  Talk to your health care provider about your screening options, when you should start screening, and how often you should be screened. Colorectal Cancer  Routine colorectal cancer screening usually begins at 45 years of age and should be repeated every 5-10 years until you are 45 years old. You may need to be screened more often if early forms of precancerous polyps or small growths are found. Your health care provider may recommend screening at an earlier age if you have risk factors for colon cancer.  Your health care provider may recommend using home test kits to check for hidden blood in the stool.  A small camera at the end of a tube can be used to examine your colon (sigmoidoscopy or colonoscopy). This checks for the earliest forms of colorectal cancer. Prostate and Testicular Cancer  Depending on your age and overall health, your health care provider may do certain tests to screen for prostate and testicular cancer.  Talk to your health care provider about any symptoms or concerns you have about testicular or prostate cancer. Skin Cancer  Check your skin from head   to toe regularly.  Tell your health care provider about any new moles or changes in moles, especially if:  There is a change in a mole's size, shape, or color.  You have a mole that is larger than a pencil eraser.  Always use sunscreen. Apply sunscreen liberally and repeat throughout the day.  Protect yourself by wearing long sleeves, pants, a wide-brimmed hat, and sunglasses when outside. What should I know about heart disease, diabetes, and high blood  pressure?  If you are 18-39 years of age, have your blood pressure checked every 3-5 years. If you are 40 years of age or older, have your blood pressure checked every year. You should have your blood pressure measured twice-once when you are at a hospital or clinic, and once when you are not at a hospital or clinic. Record the average of the two measurements. To check your blood pressure when you are not at a hospital or clinic, you can use:  An automated blood pressure machine at a pharmacy.  A home blood pressure monitor.  Talk to your health care provider about your target blood pressure.  If you are between 45-79 years old, ask your health care provider if you should take aspirin to prevent heart disease.  Have regular diabetes screenings by checking your fasting blood sugar level.  If you are at a normal weight and have a low risk for diabetes, have this test once every three years after the age of 45.  If you are overweight and have a high risk for diabetes, consider being tested at a younger age or more often.  A one-time screening for abdominal aortic aneurysm (AAA) by ultrasound is recommended for men aged 65-75 years who are current or former smokers. What should I know about preventing infection? Hepatitis B  If you have a higher risk for hepatitis B, you should be screened for this virus. Talk with your health care provider to find out if you are at risk for hepatitis B infection. Hepatitis C  Blood testing is recommended for:  Everyone born from 1945 through 1965.  Anyone with known risk factors for hepatitis C. Sexually Transmitted Diseases (STDs)  You should be screened each year for STDs including gonorrhea and chlamydia if:  You are sexually active and are younger than 45 years of age.  You are older than 45 years of age and your health care provider tells you that you are at risk for this type of infection.  Your sexual activity has changed since you were last  screened and you are at an increased risk for chlamydia or gonorrhea. Ask your health care provider if you are at risk.  Talk with your health care provider about whether you are at high risk of being infected with HIV. Your health care provider may recommend a prescription medicine to help prevent HIV infection. What else can I do?  Schedule regular health, dental, and eye exams.  Stay current with your vaccines (immunizations).  Do not use any tobacco products, such as cigarettes, chewing tobacco, and e-cigarettes. If you need help quitting, ask your health care provider.  Limit alcohol intake to no more than 2 drinks per day. One drink equals 12 ounces of beer, 5 ounces of wine, or 1 ounces of hard liquor.  Do not use street drugs.  Do not share needles.  Ask your health care provider for help if you need support or information about quitting drugs.  Tell your health care provider if you   often feel depressed.  Tell your health care provider if you have ever been abused or do not feel safe at home. This information is not intended to replace advice given to you by your health care provider. Make sure you discuss any questions you have with your health care provider. Document Released: 05/11/2008 Document Revised: 07/12/2016 Document Reviewed: 08/17/2015 Elsevier Interactive Patient Education  2017 [employer]  American Heart Association (AHA) Exercise Recommendation  Being physically active is important to prevent heart disease and stroke, the nation's No. 1and No. 5killers. To improve overall cardiovascular health, we suggest at least 150 minutes per week of moderate exercise or 75 minutes per week of vigorous exercise (or a combination of moderate and vigorous activity). Thirty minutes a day, five times a week is an easy goal to remember. You will also experience benefits even if you divide your time into two or three segments of 10 to 15 minutes per day.  For people who would  benefit from lowering their blood pressure or cholesterol, we recommend 40 minutes of aerobic exercise of moderate to vigorous intensity three to four times a week to lower the risk for heart attack and stroke.  Physical activity is anything that makes you move your body and burn calories.  This includes things like climbing stairs or playing sports. Aerobic exercises benefit your heart, and include walking, jogging, swimming or biking. Strength and stretching exercises are best for overall stamina and flexibility.  The simplest, positive change you can make to effectively improve your heart health is to start walking. It's enjoyable, free, easy, social and great exercise. A walking program is flexible and boasts high success rates because people can stick with it. It's easy for walking to become a regular and satisfying part of life.   For Overall Cardiovascular Health:  At least 30 minutes of moderate-intensity aerobic activity at least 5 days per week for a total of 150  OR   At least 25 minutes of vigorous aerobic activity at least 3 days per week for a total of 75 minutes; or a combination of moderate- and vigorous-intensity aerobic activity  AND   Moderate- to high-intensity muscle-strengthening activity at least 2 days per week for additional health benefits.  For Lowering Blood Pressure and Cholesterol  An average 40 minutes of moderate- to vigorous-intensity aerobic activity 3 or 4 times per week  What if I can't make it to the time goal? Something is always better than nothing! And everyone has to start somewhere. Even if you've been sedentary for years, today is the day you can begin to make healthy changes in your life. If you don't think you'll make it for 30 or 40 minutes, set a reachable goal for today. You can work up toward your overall goal by increasing your time as you get stronger. Don't let all-or-nothing thinking rob you of doing what you can every day.   Source:http://www.heart.org    

## 2017-04-18 NOTE — Progress Notes (Signed)
Edwin Gilbert 46 y.o.   Chief Complaint  Patient presents with  . Annual Exam    CPE    HISTORY OF PRESENT ILLNESS: This is a 46 y.o. male here for annual exam.  HPI   Prior to Admission medications   Medication Sig Start Date End Date Taking? Authorizing Provider  celecoxib (CELEBREX) 200 MG capsule Take 200 mg by mouth 2 (two) times daily.   Yes [provider]  oxyCODONE (OXY IR/ROXICODONE) 5 MG immediate release tablet Take 1-3 tablets (5-15 mg total) by mouth every 4 (four) hours as needed. Patient not taking: Reported on 01/24/2015 01/12/14   Tarry Kos, MD  oxyCODONE-acetaminophen (PERCOCET/ROXICET) 5-325 MG per tablet Take 2 tablets by mouth every 4 (four) hours as needed for severe pain. Patient not taking: Reported on 04/18/2017 01/24/15   Oswaldo Conroy, PA-C    Allergies  Allergen Reactions  . Lactose Intolerance (Gi) Diarrhea    Patient Active Problem List   Diagnosis Date Noted  . 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 History of kidney stones   . Spondylosis     Past Surgical History:  Procedure Laterality Date  . FOOT OSTEOTOMY Left    hammer toe  . FRACTURE SURGERY    . LASIK    . MOUTH SURGERY     tooth  . ORIF CLAVICULAR FRACTURE Left 01/12/2014   Procedure: OPEN REDUCTION INTERNAL FIXATION (ORIF) LEFT CLAVICLE WITH BONE GRAFTING;  Surgeon: Cheral Almas, MD;  Location: [facility] OR;  Service: Orthopedics;  Laterality: Left;  . WRIST SURGERY Right    fx    Social History   Social History  . Marital status: Married    Spouse name: N/A  . Number of children: N/A  . Years of education: N/A   Occupational History  . Not on file.   Social History Main Topics  . Smoking status: Never Smoker  . Smokeless tobacco: Never Used     Comment: occ alcohol  . Alcohol use Yes  . Drug use: No  . Sexual activity: Not on file   Other Topics Concern  . Not on file   Social History  Narrative  . No narrative on file    Family History  Problem Relation Age of Onset  . Hypertension Father   . Heart disease Father      Review of Systems  Constitutional: Negative.  Negative for chills, fever, malaise/fatigue and weight loss.  HENT: Negative.  Negative for ear pain, hearing loss, nosebleeds and sore throat.   Eyes: Negative.  Negative for blurred vision, double vision and pain.  Respiratory: Negative.  Negative for cough and shortness of breath.   Cardiovascular: Negative.  Negative for chest pain, palpitations and leg swelling.  Gastrointestinal: Negative for abdominal pain, blood in stool, diarrhea, melena, nausea and vomiting.  Genitourinary: Negative.  Negative for dysuria and hematuria.  Musculoskeletal: Positive for back pain. Negative for joint pain.  Skin: Negative.  Negative for rash.  Neurological: Negative for dizziness, sensory change, focal weakness, seizures and headaches.  Endo/Heme/Allergies: Negative.   Psychiatric/Behavioral: Negative.   All other systems reviewed and are negative.  Vitals:   04/18/17 0932  BP: 122/74  Pulse: (!) 54  Resp: 18  Temp: 97.9 F (36.6 C)     Physical Exam  Constitutional: He is oriented to person, place, and time. He appears well-developed and well-nourished.  HENT:  Head: Normocephalic and atraumatic.  Right Ear: External ear normal.  Left Ear: External ear normal.  Nose: Nose normal.  Mouth/Throat: Oropharynx is clear and moist.  Eyes: Conjunctivae and EOM are normal. Pupils are equal, round, and reactive to light.  Neck: Normal range of motion. Neck supple. No JVD present. No thyromegaly present.  Cardiovascular: Normal rate, regular rhythm, normal heart sounds and intact distal pulses.   Pulmonary/Chest: Effort normal and breath sounds normal.  Abdominal: Soft. Bowel sounds are normal. He exhibits no distension. There is no tenderness.  Musculoskeletal: Normal range of motion.  Lymphadenopathy:     He has no cervical adenopathy.  Neurological: He is alert and oriented to person, place, and time. No sensory deficit. He exhibits normal muscle tone.  Skin: Skin is warm and dry. Capillary refill takes less than 2 seconds. No rash noted.  Psychiatric: He has a normal mood and affect. His behavior is normal.  Vitals reviewed.    ASSESSMENT & PLAN: Edwin Gilbert was seen today for annual exam.  Diagnoses and all orders for this visit:  Routine general medical examination at a health care facility -     CBC with Differential -     Comprehensive metabolic panel -     Hemoglobin A1c -     Lipid panel -     TSH -     PSA(Must document that pt has been informed of limitations of PSA testing.) -     HIV antibody    Patient Instructions       IF you received an x-ray today, you will receive an invoice from [facility]. Please contact [facility] at [phone] with questions or concerns regarding your invoice.   IF you received labwork today, you will receive an invoice from [facility]. Please contact [facility] at [phone] with questions or concerns regarding your invoice.   Our billing staff will not be able to assist you with questions regarding bills from these companies.  You will be contacted with the lab results as soon as they are available. The fastest way to get your results is to activate your My Chart account. Instructions are located on the last page of this paperwork. If you have not heard from us regarding the results in 2 weeks, please contact this office.         Health Maintenance, Male A healthy lifestyle and preventive care is important for your health and wellness. Ask your health care provider about what schedule of regular examinations is right for you. What should I know about weight and diet?  Eat a Healthy Diet  Eat plenty of vegetables, fruits, whole grains, low-fat dairy products, and lean protein.  Do not eat a lot of foods high in  solid fats, added sugars, or salt. Maintain a Healthy Weight  Regular exercise can help you achieve or maintain a healthy weight. You should:  Do at least 150 minutes of exercise each week. The exercise should increase your heart rate and make you sweat (moderate-intensity exercise).  Do strength-training exercises at least twice a week. Watch Your Levels of Cholesterol and Blood Lipids  Have your blood tested for lipids and cholesterol every 5 years starting at 45 years of age. If you are at high risk for heart disease, you should start having your blood tested when you are 45 years old. You may need to have your cholesterol levels checked more often if:  Your lipid or cholesterol levels are high.  You are older than 45 years of age.  You  are at high risk for heart disease. What should I know about cancer screening? Many types of cancers can be detected early and may often be prevented. Lung Cancer  You should be screened every year for lung cancer if:  You are a current smoker who has smoked for at least 30 years.  You are a former smoker who has quit within the past 15 years.  Talk to your health care provider about your screening options, when you should start screening, and how often you should be screened. Colorectal Cancer  Routine colorectal cancer screening usually begins at 45 years of age and should be repeated every 5-10 years until you are 45 years old. You may need to be screened more often if early forms of precancerous polyps or small growths are found. Your health care provider may recommend screening at an earlier age if you have risk factors for colon cancer.  Your health care provider may recommend using home test kits to check for hidden blood in the stool.  A small camera at the end of a tube can be used to examine your colon (sigmoidoscopy or colonoscopy). This checks for the earliest forms of colorectal cancer. Prostate and Testicular Cancer  Depending on  your age and overall health, your health care provider may do certain tests to screen for prostate and testicular cancer.  Talk to your health care provider about any symptoms or concerns you have about testicular or prostate cancer. Skin Cancer  Check your skin from head to toe regularly.  Tell your health care provider about any new moles or changes in moles, especially if:  There is a change in a mole's size, shape, or color.  You have a mole that is larger than a pencil eraser.  Always use sunscreen. Apply sunscreen liberally and repeat throughout the day.  Protect yourself by wearing long sleeves, pants, a wide-brimmed hat, and sunglasses when outside. What should I know about heart disease, diabetes, and high blood pressure?  If you are 42-14 years of age, have your blood pressure checked every 3-5 years. If you are 53 years of age or older, have your blood pressure checked every year. You should have your blood pressure measured twice-once when you are at a hospital or clinic, and once when you are not at a hospital or clinic. Record the average of the two measurements. To check your blood pressure when you are not at a hospital or clinic, you can use:  An automated blood pressure machine at a pharmacy.  A home blood pressure monitor.  Talk to your health care provider about your target blood pressure.  If you are between 39-25 years old, ask your health care provider if you should take aspirin to prevent heart disease.  Have regular diabetes screenings by checking your fasting blood sugar level.  If you are at a normal weight and have a low risk for diabetes, have this test once every three years after the age of 27.  If you are overweight and have a high risk for diabetes, consider being tested at a younger age or more often.  A one-time screening for abdominal aortic aneurysm (AAA) by ultrasound is recommended for men aged 65-75 years who are current or former  smokers. What should I know about preventing infection? Hepatitis B  If you have a higher risk for hepatitis B, you should be screened for this virus. Talk with your health care provider to find out if you are at risk for  hepatitis B infection. Hepatitis C  Blood testing is recommended for:  Everyone born from [birth year] through 1964.  Anyone with known risk factors for hepatitis C. Sexually Transmitted Diseases (STDs)  You should be screened each year for STDs including gonorrhea and chlamydia if:  You are sexually active and are younger than 45 years of age.  You are older than 45 years of age and your health care provider tells you that you are at risk for this type of infection.  Your sexual activity has changed since you were last screened and you are at an increased risk for chlamydia or gonorrhea. Ask your health care provider if you are at risk.  Talk with your health care provider about whether you are at high risk of being infected with HIV. Your health care provider may recommend a prescription medicine to help prevent HIV infection. What else can I do?  Schedule regular health, dental, and eye exams.  Stay current with your vaccines (immunizations).  Do not use any tobacco products, such as cigarettes, chewing tobacco, and e-cigarettes. If you need help quitting, ask your health care provider.  Limit alcohol intake to no more than 2 drinks per day. One drink equals 12 ounces of beer, 5 ounces of wine, or 1 ounces of hard liquor.  Do not use street drugs.  Do not share needles.  Ask your health care provider for help if you need support or information about quitting drugs.  Tell your health care provider if you often feel depressed.  Tell your health care provider if you have ever been abused or do not feel safe at home. This information is not intended to replace advice given to you by your health care provider. Make sure you discuss any questions you have with your  health care provider. Document Released: 05/11/2008 Document Revised: 07/12/2016 Document Reviewed: 08/17/2015 Elsevier Interactive Patient Education  2017 Elsevier Inc.  American [facility]) Exercise Recommendation  Being physically active is important to prevent heart disease and stroke, the nation's No. 1and No. 5killers. To improve overall cardiovascular health, we suggest at least 150 minutes per week of moderate exercise or 75 minutes per week of vigorous exercise (or a combination of moderate and vigorous activity). Thirty minutes a day, five times a week is an easy goal to remember. You will also experience benefits even if you divide your time into two or three segments of 10 to 15 minutes per day.  For people who would benefit from lowering their blood pressure or cholesterol, we recommend 40 minutes of aerobic exercise of moderate to vigorous intensity three to four times a week to lower the risk for heart attack and stroke.  Physical activity is anything that makes you move your body and burn calories.  This includes things like climbing stairs or playing sports. Aerobic exercises benefit your heart, and include walking, jogging, swimming or biking. Strength and stretching exercises are best for overall stamina and flexibility.  The simplest, positive change you can make to effectively improve your heart health is to start walking. It's enjoyable, free, easy, social and great exercise. A walking program is flexible and boasts high success rates because people can stick with it. It's easy for walking to become a regular and satisfying part of life.   For Overall Cardiovascular Health:  At least 30 minutes of moderate-intensity aerobic activity at least 5 days per week for a total of 150  OR   At least 25 minutes of vigorous aerobic  activity at least 3 days per week for a total of 75 minutes; or a combination of moderate- and vigorous-intensity aerobic  activity  AND   Moderate- to high-intensity muscle-strengthening activity at least 2 days per week for additional health benefits.  For Lowering Blood Pressure and Cholesterol  An average 40 minutes of moderate- to vigorous-intensity aerobic activity 3 or 4 times per week  What if I can't make it to the time goal? Something is always better than nothing! And everyone has to start somewhere. Even if you've been sedentary for years, today is the day you can begin to make healthy changes in your life. If you don't think you'll make it for 30 or 40 minutes, set a reachable goal for today. You can work up toward your overall goal by increasing your time as you get stronger. Don't let all-or-nothing thinking rob you of doing what you can every day.  Source:[email or web address], MD [facility]

## 2017-04-19 ENCOUNTER — Encounter: Payer: Self-pay | Admitting: Radiology

## 2017-04-19 LAB — CBC WITH DIFFERENTIAL/PLATELET
BASOS ABS: 0 10*3/uL (ref 0.0–0.2)
Basos: 0 %
EOS (ABSOLUTE): 0 10*3/uL (ref 0.0–0.4)
EOS: 1 %
Hematocrit: 39.7 % (ref 37.5–51.0)
Hemoglobin: 12.8 g/dL — ABNORMAL LOW (ref 13.0–17.7)
IMMATURE GRANULOCYTES: 0 %
Immature Grans (Abs): 0 10*3/uL (ref 0.0–0.1)
LYMPHS: 30 %
Lymphocytes Absolute: 1.9 10*3/uL (ref 0.7–3.1)
MCH: 29.3 pg (ref 26.6–33.0)
MCHC: 32.2 g/dL (ref 31.5–35.7)
MCV: 91 fL (ref 79–97)
MONOCYTES: 6 %
MONOS ABS: 0.4 10*3/uL (ref 0.1–0.9)
NEUTROS PCT: 63 %
Neutrophils Absolute: 4 10*3/uL (ref 1.4–7.0)
Platelets: 236 10*3/uL (ref 150–379)
RBC: 4.37 x10E6/uL (ref 4.14–5.80)
RDW: 14.1 % (ref 12.3–15.4)
WBC: 6.3 10*3/uL (ref 3.4–10.8)

## 2017-04-19 LAB — COMPREHENSIVE METABOLIC PANEL
ALT: 12 IU/L (ref 0–44)
AST: 19 IU/L (ref 0–40)
Albumin/Globulin Ratio: 1.4 (ref 1.2–2.2)
Albumin: 4.3 g/dL (ref 3.5–5.5)
Alkaline Phosphatase: 116 IU/L (ref 39–117)
BUN/Creatinine Ratio: 10 (ref 9–20)
BUN: 10 mg/dL (ref 6–24)
Bilirubin Total: 0.5 mg/dL (ref 0.0–1.2)
CALCIUM: 9 mg/dL (ref 8.7–10.2)
CO2: 26 mmol/L (ref 18–29)
CREATININE: 0.97 mg/dL (ref 0.76–1.27)
Chloride: 102 mmol/L (ref 96–106)
GFR calc Af Amer: 109 mL/min/{1.73_m2} (ref >59)
GFR, EST NON AFRICAN AMERICAN: 95 mL/min/{1.73_m2} (ref >59)
Globulin, Total: 3 g/dL (ref 1.5–4.5)
Glucose: 96 mg/dL (ref 65–99)
Potassium: 4.5 mmol/L (ref 3.5–5.2)
Sodium: 140 mmol/L (ref 134–144)
Total Protein: 7.3 g/dL (ref 6.0–8.5)

## 2017-04-19 LAB — LIPID PANEL
Chol/HDL Ratio: 3.4 ratio (ref 0.0–5.0)
Cholesterol, Total: 154 mg/dL (ref 100–199)
HDL: 45 mg/dL (ref >39)
LDL Calculated: 97 mg/dL (ref 0–99)
Triglycerides: 61 mg/dL (ref 0–149)
VLDL CHOLESTEROL CAL: 12 mg/dL (ref 5–40)

## 2017-04-19 LAB — PSA: Prostate Specific Ag, Serum: 2.7 ng/mL (ref 0.0–4.0)

## 2017-04-19 LAB — HEMOGLOBIN A1C
ESTIMATED AVERAGE GLUCOSE: 114 mg/dL
Hgb A1c MFr Bld: 5.6 % (ref 4.8–5.6)

## 2017-04-19 LAB — TSH: TSH: 1.58 u[IU]/mL (ref 0.450–4.500)

## 2017-04-19 LAB — HIV ANTIBODY (ROUTINE TESTING W REFLEX): HIV SCREEN 4TH GENERATION: NONREACTIVE

## 2017-05-14 ENCOUNTER — Ambulatory Visit (INDEPENDENT_AMBULATORY_CARE_PROVIDER_SITE_OTHER): Payer: BLUE CROSS/BLUE SHIELD | Admitting: Urgent Care

## 2017-05-14 ENCOUNTER — Encounter: Payer: Self-pay | Admitting: Urgent Care

## 2017-05-14 VITALS — BP 117/70 | HR 54 | Temp 97.7°F | Resp 18 | Ht 70.0 in | Wt 158.4 lb

## 2017-05-14 DIAGNOSIS — Z23 Encounter for immunization: Secondary | ICD-10-CM | POA: Diagnosis not present

## 2017-05-14 DIAGNOSIS — Z7189 Other specified counseling: Secondary | ICD-10-CM | POA: Diagnosis not present

## 2017-05-14 DIAGNOSIS — Z7185 Encounter for immunization safety counseling: Secondary | ICD-10-CM

## 2017-05-14 NOTE — Patient Instructions (Addendum)
Tdap Vaccine (Tetanus, Diphtheria and Pertussis): What You Need to Know 1. Why get vaccinated? Tetanus, diphtheria and pertussis are very serious diseases. Tdap vaccine can protect us from these diseases. And, Tdap vaccine given to pregnant women can protect newborn babies against pertussis. TETANUS (Lockjaw) is rare in the [place] today. It causes painful muscle tightening and stiffness, usually all over the body.  It can lead to tightening of muscles in the head and neck so you can't open your mouth, swallow, or sometimes even breathe. Tetanus kills about 1 out of 10 people who are infected even after receiving the best medical care.  DIPHTHERIA is also rare in the [place] today. It can cause a thick coating to form in the back of the throat.  It can lead to breathing problems, heart failure, paralysis, and death.  PERTUSSIS (Whooping Cough) causes severe coughing spells, which can cause difficulty breathing, vomiting and disturbed sleep.  It can also lead to weight loss, incontinence, and rib fractures. Up to 2 in 100 adolescents and 5 in 100 adults with pertussis are hospitalized or have complications, which could include pneumonia or death.  These diseases are caused by bacteria. Diphtheria and pertussis are spread from person to person through secretions from coughing or sneezing. Tetanus enters the body through cuts, scratches, or wounds. Before vaccines, as many as 200,000 cases of diphtheria, 200,000 cases of pertussis, and hundreds of cases of tetanus, were reported in the [place] each year. Since vaccination began, reports of cases for tetanus and diphtheria have dropped by about 99% and for pertussis by about 80%. 2. Tdap vaccine Tdap vaccine can protect adolescents and adults from tetanus, diphtheria, and pertussis. One dose of Tdap is routinely given at age 11 or 12. People who did not get Tdap at that age should get it as soon as possible. Tdap is especially  important for healthcare professionals and anyone having close contact with a baby younger than 12 months. Pregnant women should get a dose of Tdap during every pregnancy, to protect the newborn from pertussis. Infants are most at risk for severe, life-threatening complications from pertussis. Another vaccine, called Td, protects against tetanus and diphtheria, but not pertussis. A Td booster should be given every 10 years. Tdap may be given as one of these boosters if you have never gotten Tdap before. Tdap may also be given after a severe cut or burn to prevent tetanus infection. Your doctor or the person giving you the vaccine can give you more information. Tdap may safely be given at the same time as other vaccines. 3. Some people should not get this vaccine  A person who has ever had a life-threatening allergic reaction after a previous dose of any diphtheria, tetanus or pertussis containing vaccine, OR has a severe allergy to any part of this vaccine, should not get Tdap vaccine. Tell the person giving the vaccine about any severe allergies.  Anyone who had coma or long repeated seizures within 7 days after a childhood dose of DTP or DTaP, or a previous dose of Tdap, should not get Tdap, unless a cause other than the vaccine was found. They can still get Td.  Talk to your doctor if you: ? have seizures or another nervous system problem, ? had severe pain or swelling after any vaccine containing diphtheria, tetanus or pertussis, ? ever had a condition called Guillain-Barr Syndrome (GBS), ? aren't feeling well on the day the shot is scheduled. 4. Risks With any medicine, including   vaccines, there is a chance of side effects. These are usually mild and go away on their own. Serious reactions are also possible but are rare. Most people who get Tdap vaccine do not have any problems with it. Mild problems following Tdap: (Did not interfere with activities)  Pain where the shot was given (about  3 in 4 adolescents or 2 in 3 adults)  Redness or swelling where the shot was given (about 1 person in 5)  Mild fever of at least 100.4F (up to about 1 in 25 adolescents or 1 in 100 adults)  Headache (about 3 or 4 people in 10)  Tiredness (about 1 person in 3 or 4)  Nausea, vomiting, diarrhea, stomach ache (up to 1 in 4 adolescents or 1 in 10 adults)  Chills, sore joints (about 1 person in 10)  Body aches (about 1 person in 3 or 4)  Rash, swollen glands (uncommon)  Moderate problems following Tdap: (Interfered with activities, but did not require medical attention)  Pain where the shot was given (up to 1 in 5 or 6)  Redness or swelling where the shot was given (up to about 1 in 16 adolescents or 1 in 12 adults)  Fever over 102F (about 1 in 100 adolescents or 1 in 250 adults)  Headache (about 1 in 7 adolescents or 1 in 10 adults)  Nausea, vomiting, diarrhea, stomach ache (up to 1 or 3 people in 100)  Swelling of the entire arm where the shot was given (up to about 1 in 500).  Severe problems following Tdap: (Unable to perform usual activities; required medical attention)  Swelling, severe pain, bleeding and redness in the arm where the shot was given (rare).  Problems that could happen after any vaccine:  People sometimes faint after a medical procedure, including vaccination. Sitting or lying down for about 15 minutes can help prevent fainting, and injuries caused by a fall. Tell your doctor if you feel dizzy, or have vision changes or ringing in the ears.  Some people get severe pain in the shoulder and have difficulty moving the arm where a shot was given. This happens very rarely.  Any medication can cause a severe allergic reaction. Such reactions from a vaccine are very rare, estimated at fewer than 1 in a million doses, and would happen within a few minutes to a few hours after the vaccination. As with any medicine, there is a very remote chance of a vaccine  causing a serious injury or death. The safety of vaccines is always being monitored. For more information, visit: www.cdc.gov/vaccinesafety/ 5. What if there is a serious problem? What should I look for? Look for anything that concerns you, such as signs of a severe allergic reaction, very high fever, or unusual behavior. Signs of a severe allergic reaction can include hives, swelling of the face and throat, difficulty breathing, a fast heartbeat, dizziness, and weakness. These would usually start a few minutes to a few hours after the vaccination. What should I do?  If you think it is a severe allergic reaction or other emergency that can't wait, call 9-1-1 or get the person to the nearest hospital. Otherwise, call your doctor.  Afterward, the reaction should be reported to the Vaccine Adverse Event Reporting System (VAERS). Your doctor might file this report, or you can do it yourself through the VAERS web site at www.vaers.hhs.gov, or by calling 1-800-822-7967. ? VAERS does not give medical advice. 6. The National Vaccine Injury Compensation Program The National   Vaccine Injury Compensation Program (VICP) is a federal program that was created to compensate people who may have been injured by certain vaccines. Persons who believe they may have been injured by a vaccine can learn about the program and about filing a claim by calling 1-916-717-9432 or visiting the VICP website at SpiritualWord.atwww.hrsa.gov/vaccinecompensation. There is a time limit to file a claim for compensation. 7. How can I learn more?  Ask your doctor. He or she can give you the vaccine package insert or suggest other sources of information.  Call your local or state health department.  Contact the Centers for Disease Control and Prevention (CDC): ? Call 251-388-01311-609-618-1465 (1-800-CDC-INFO) or ? Visit CDC's website at PicCapture.uywww.cdc.gov/vaccines CDC Tdap Vaccine VIS (01/20/14) This information is not intended to replace advice given to you by your  health care provider. Make sure you discuss any questions you have with your health care provider. Document Released: 05/14/2012 Document Revised: 08/03/2016 Document Reviewed: 08/03/2016 Elsevier Interactive Patient Education  2017 [employer]     IF you received an x-ray today, you will receive an invoice from [facility]. Please contact [facility] at [phone] with questions or concerns regarding your invoice.   IF you received labwork today, you will receive an invoice from [facility]. Please contact [facility] at [phone] with questions or concerns regarding your invoice.   Our billing staff will not be able to assist you with questions regarding bills from these companies.  You will be contacted with the lab results as soon as they are available. The fastest way to get your results is to activate your My Chart account. Instructions are located on the last page of this paperwork. If you have not heard from us regarding the results in 2 weeks, please contact this office.

## 2017-05-14 NOTE — Progress Notes (Signed)
  MRN: [id number] DOB: 07/01/1971  Subjective:   Edwin Gilbert is a 46 y.o. 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 He cannot recall the last time he had one. Denies history of allergic reactions to immunizations.  Edwin Gilbert has a current medication list which includes the following prescription(s): celecoxib. Also is allergic to lactose intolerance (gi). Edwin Gilbert  has a past medical history of History of kidney stones and Spondylosis. Also  has a past surgical history that includes Foot Osteotomy (Left); Mouth surgery; Wrist surgery (Right); LASIK; ORIF clavicular fracture (Left, 01/12/2014); and Fracture surgery.  Objective:   Vitals: BP 117/70 (BP Location: Right Arm, Patient Position: Sitting, Cuff Size: Normal)   Pulse (!) 54   Temp 97.7 F (36.5 C) (Oral)   Resp 18   Ht 5\' 10"  (1.778 m)   Wt 158 lb 6.4 oz (71.8 kg)   SpO2 97%   BMI 22.73 kg/m   Physical Exam  Constitutional: He is oriented to person, place, and time. He appears well-developed and well-nourished.  Cardiovascular: Normal rate.   Pulmonary/Chest: Effort normal.  Neurological: He is alert and oriented to person, place, and time.   Assessment and Plan :   1. Immunization counseling 2. Need for Tdap vaccination - Tdap updated today.  Wallis BambergMario Siham Bucaro, PA-C [facility] [phone] 05/14/2017  8:32 AM

## 2017-06-25 DIAGNOSIS — M450 Ankylosing spondylitis of multiple sites in spine: Secondary | ICD-10-CM | POA: Diagnosis not present

## 2017-06-25 DIAGNOSIS — Z79899 Other long term (current) drug therapy: Secondary | ICD-10-CM | POA: Diagnosis not present

## 2017-10-24 DIAGNOSIS — M450 Ankylosing spondylitis of multiple sites in spine: Secondary | ICD-10-CM | POA: Diagnosis not present

## 2017-10-26 DIAGNOSIS — R000 Tachycardia, unspecified: Secondary | ICD-10-CM | POA: Diagnosis not present

## 2017-10-26 DIAGNOSIS — F419 Anxiety disorder, unspecified: Secondary | ICD-10-CM | POA: Diagnosis not present

## 2018-02-15 DIAGNOSIS — L603 Nail dystrophy: Secondary | ICD-10-CM | POA: Diagnosis not present

## 2018-02-15 DIAGNOSIS — B070 Plantar wart: Secondary | ICD-10-CM | POA: Diagnosis not present

## 2018-04-23 DIAGNOSIS — M450 Ankylosing spondylitis of multiple sites in spine: Secondary | ICD-10-CM | POA: Diagnosis not present

## 2018-04-23 DIAGNOSIS — Z79899 Other long term (current) drug therapy: Secondary | ICD-10-CM | POA: Diagnosis not present

## 2018-07-30 DIAGNOSIS — B070 Plantar wart: Secondary | ICD-10-CM | POA: Diagnosis not present

## 2018-08-28 DIAGNOSIS — M450 Ankylosing spondylitis of multiple sites in spine: Secondary | ICD-10-CM | POA: Diagnosis not present

## 2018-08-28 DIAGNOSIS — Z79899 Other long term (current) drug therapy: Secondary | ICD-10-CM | POA: Diagnosis not present

## 2018-10-16 DIAGNOSIS — S0502XA Injury of conjunctiva and corneal abrasion without foreign body, left eye, initial encounter: Secondary | ICD-10-CM | POA: Diagnosis not present

## 2018-10-18 DIAGNOSIS — S0501XD Injury of conjunctiva and corneal abrasion without foreign body, right eye, subsequent encounter: Secondary | ICD-10-CM | POA: Diagnosis not present

## 2018-10-18 DIAGNOSIS — Z3009 Encounter for other general counseling and advice on contraception: Secondary | ICD-10-CM | POA: Diagnosis not present

## 2019-05-21 ENCOUNTER — Other Ambulatory Visit: Payer: Self-pay | Admitting: Internal Medicine

## 2019-05-21 DIAGNOSIS — H53451 Other localized visual field defect, right eye: Secondary | ICD-10-CM

## 2019-05-21 DIAGNOSIS — R519 Headache, unspecified: Secondary | ICD-10-CM

## 2019-05-22 ENCOUNTER — Ambulatory Visit
Admission: RE | Admit: 2019-05-22 | Discharge: 2019-05-22 | Disposition: A | Discharge status: Home / Self Care | Payer: Managed Care, Other (non HMO) | Source: Ambulatory Visit | Attending: Internal Medicine | Admitting: Internal Medicine

## 2019-05-22 ENCOUNTER — Other Ambulatory Visit: Payer: Self-pay

## 2019-05-22 DIAGNOSIS — H53451 Other localized visual field defect, right eye: Secondary | ICD-10-CM

## 2019-05-22 DIAGNOSIS — R519 Headache, unspecified: Secondary | ICD-10-CM

## 2019-06-03 ENCOUNTER — Other Ambulatory Visit: Payer: Self-pay

## 2019-06-03 ENCOUNTER — Ambulatory Visit
Admission: RE | Admit: 2019-06-03 | Discharge: 2019-06-03 | Disposition: A | Discharge status: Home / Self Care | Payer: Managed Care, Other (non HMO) | Source: Ambulatory Visit | Attending: Internal Medicine | Admitting: Internal Medicine

## 2019-06-03 DIAGNOSIS — H53451 Other localized visual field defect, right eye: Secondary | ICD-10-CM

## 2019-06-03 DIAGNOSIS — R519 Headache, unspecified: Secondary | ICD-10-CM

## 2019-06-30 NOTE — Progress Notes (Signed)
NEUROLOGY CONSULTATION NOTE  Edwin BowensJason K Gilbert MRN: [id number] DOB: 07/17/1971  Referring provider: Georgann HousekeeperKarrar Husain, MD Primary care provider: Kirby FunkJohn Griffin, MD  Reason for consult:  Recurrent transient episode of decreased vision  HISTORY OF PRESENT ILLNESS: Edwin Gilbert is a 48 year old right-handed male who presents for recurrent transient episode of decreased vision.  History supplemented by referring provider note.  In June, he developed loss of peripheral vision on his right side.  He was typing at the computer and noted he couldn't see all of his fingers on his right hand (fingers became translucent, not darkening of vision).  Visual disturbance was present when closing either eye.  This lasted about 10 minutes.  He felt "strange" but didn't have a headache.  He was trying to tell his girlfriend his symptoms and had difficulty getting words out.  He immediately took a nap and when he woke up 2 to 3 hours later, it had resolved.    It occurred again 2 weeks later.  [facility] and while looking at some people in the right sided of his vision in the distance, they would seem to fade in and out.  He went to dinner and had trouble getting words out again.  Visual symptoms lasted about 30 minutes the fuzzy feeling and speech difficulty lasted about 2 to 3 hours.  He had no headache, double vision, slurred speech, facial droop or unilateral numbness or weakness.  He was evaluated by optometry on 05/22/19 and had a normal exam.  MRI of brain without contrast performed on 05/22/19 was personally reviewed and showed nonspecific minimal punctate subcortical white matter hyperintense foci in the left frontal lobe.  A carotid doppler performed on 06/03/19 showed minor carotid atherosclerosis with less than 50% ICA stenosis bilaterally and antegrade flow in both vertebral arteries.  He denies prior history of headaches.  His brother has migraines.  PAST MEDICAL HISTORY: Past Medical History:   Diagnosis Date  . History of kidney stones   . Spondylosis    ankylosing spondylitis    PAST SURGICAL HISTORY: Past Surgical History:  Procedure Laterality Date  . FOOT OSTEOTOMY Left    hammer toe  . FRACTURE SURGERY    . LASIK    . MOUTH SURGERY     tooth  . ORIF CLAVICULAR FRACTURE Left 01/12/2014   Procedure: OPEN REDUCTION INTERNAL FIXATION (ORIF) LEFT CLAVICLE WITH BONE GRAFTING;  Surgeon: Cheral AlmasNaiping Michael Xu, MD;  Location: [facility] OR;  Service: Orthopedics;  Laterality: Left;  . WRIST SURGERY Right    fx    MEDICATIONS: Current Outpatient Medications on File Prior to Visit  Medication Sig Dispense Refill  . celecoxib (CELEBREX) 200 MG capsule Take 200 mg by mouth 2 (two) times daily.     No current facility-administered medications on file prior to visit.     ALLERGIES: Allergies  Allergen Reactions  . Lactose Intolerance (Gi) Diarrhea    FAMILY HISTORY: Family History  Problem Relation Age of Onset  . Hypertension Father   . Heart disease Father     SOCIAL HISTORY: Social History   Socioeconomic History  . Marital status: Married    Spouse name: Not on file  . Number of children: Not on file  . Years of education: Not on file  . Highest education level: Not on file  Occupational History  . Not on file  Social Needs  . Financial resource strain: Not on file  . Food insecurity  Worry: Not on file    Inability: Not on file  . Transportation needs    Medical: Not on file    Non-medical: Not on file  Tobacco Use  . Smoking status: Never Smoker  . Smokeless tobacco: Never Used  . Tobacco comment: occ alcohol  Substance and Sexual Activity  . Alcohol use: Yes  . Drug use: No  . Sexual activity: Not on file  Lifestyle  . Physical activity    Days per week: Not on file    Minutes per session: Not on file  . Stress: Not on file  Relationships  . Social Herbalist on phone: Not on file    Gets together: Not on file    Attends religious  service: Not on file    Active member of club or organization: Not on file    Attends meetings of clubs or organizations: Not on file    Relationship status: Not on file  . Intimate partner violence    Fear of current or ex partner: Not on file    Emotionally abused: Not on file    Physically abused: Not on file    Forced sexual activity: Not on file  Other Topics Concern  . Not on file  Social History Narrative  . Not on file    REVIEW OF SYSTEMS: Constitutional: No fevers, chills, or sweats, no generalized fatigue, change in appetite Eyes: No visual changes, double vision, eye pain Ear, nose and throat: No hearing loss, ear pain, nasal congestion, sore throat Cardiovascular: No chest pain, palpitations Respiratory:  No shortness of breath at rest or with exertion, wheezes GastrointestinaI: No nausea, vomiting, diarrhea, abdominal pain, fecal incontinence Genitourinary:  No dysuria, urinary retention or frequency Musculoskeletal:  No neck pain, back pain Integumentary: No rash, pruritus, skin lesions Neurological: as above Psychiatric: No depression, insomnia, anxiety Endocrine: No palpitations, fatigue, diaphoresis, mood swings, change in appetite, change in weight, increased thirst Hematologic/Lymphatic:  No purpura, petechiae. Allergic/Immunologic: no itchy/runny eyes, nasal congestion, recent allergic reactions, rashes  PHYSICAL EXAM: Blood pressure 121/74, pulse (!) 51, temperature 98.5 F (36.9 C), temperature source Oral, height 5' 10.75" (1.797 m), weight 166 lb (75.3 kg), SpO2 100 %. General: No acute distress.  Patient appears well-groomed.   Head:  Normocephalic/atraumatic Eyes:  fundi examined but not visualized Neck: supple, no paraspinal tenderness, full range of motion Back: No paraspinal tenderness Heart: regular rate and rhythm Lungs: Clear to auscultation bilaterally. Vascular: No carotid bruits. Neurological Exam: Mental status: alert and oriented to  person, place, and time, recent and remote memory intact, fund of knowledge intact, attention and concentration intact, speech fluent and not dysarthric, language intact. Cranial nerves: CN I: not tested CN II: pupils equal, round and reactive to light, visual fields intact CN III, IV, VI:  full range of motion, no nystagmus, no ptosis CN V: facial sensation intact CN VII: upper and lower face symmetric CN VIII: hearing intact CN IX, X: gag intact, uvula midline CN XI: sternocleidomastoid and trapezius muscles intact CN XII: tongue midline Bulk & Tone: normal, no fasciculations. Motor:  5/5 throughout  Sensation:  Temperature and vibration sensation intact. Deep Tendon Reflexes:  2+ throughout, toes downgoing.   Finger to nose testing:  Without dysmetria.   Heel to shin:  Without dysmetria.   Gait:  Normal station and stride.  Able to turn and tandem walk. Romberg negative.  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  MRI findings show very mild  nonspecific findings that do not look remarkable.  PLAN: Two isolated episodes.  Would just monitor for now.  If he starts having recurring episodes that may require starting a preventative medication, then he is advised to make a follow up appointment.    Thank you for allowing me to take part in the care of this patient.  Shon MilletAdam [place], DO  CC:    Kirby FunkJohn Griffin, MD  Georgann HousekeeperKarrar Husain, MD

## 2019-07-01 ENCOUNTER — Encounter: Payer: Self-pay | Admitting: Neurology

## 2019-07-01 ENCOUNTER — Other Ambulatory Visit: Payer: Self-pay

## 2019-07-01 ENCOUNTER — Ambulatory Visit (INDEPENDENT_AMBULATORY_CARE_PROVIDER_SITE_OTHER): Payer: Managed Care, Other (non HMO) | Admitting: Neurology

## 2019-07-01 VITALS — BP 121/74 | HR 51 | Temp 98.5°F | Ht 70.75 in | Wt 166.0 lb

## 2019-07-01 DIAGNOSIS — G43109 Migraine with aura, not intractable, without status migrainosus: Secondary | ICD-10-CM | POA: Diagnosis not present

## 2019-07-01 NOTE — Patient Instructions (Signed)
I think you had a complicated migraine which is a migraine that presents with strange symptoms (sometimes they look like stroke).  Migraines may occur without headache as well.  I wouldn't do anything further.  If they recur and start to become more frequent, follow up with me and we can discuss medications to try and prevent them.

## 2019-12-05 DIAGNOSIS — R300 Dysuria: Secondary | ICD-10-CM | POA: Diagnosis not present

## 2019-12-05 DIAGNOSIS — R350 Frequency of micturition: Secondary | ICD-10-CM | POA: Diagnosis not present

## 2019-12-05 DIAGNOSIS — R102 Pelvic and perineal pain: Secondary | ICD-10-CM | POA: Diagnosis not present

## 2019-12-05 DIAGNOSIS — R3915 Urgency of urination: Secondary | ICD-10-CM | POA: Diagnosis not present

## 2019-12-10 DIAGNOSIS — N410 Acute prostatitis: Secondary | ICD-10-CM | POA: Diagnosis not present

## 2019-12-10 DIAGNOSIS — K5900 Constipation, unspecified: Secondary | ICD-10-CM | POA: Diagnosis not present

## 2019-12-24 DIAGNOSIS — N410 Acute prostatitis: Secondary | ICD-10-CM | POA: Diagnosis not present

## 2020-03-03 DIAGNOSIS — M450 Ankylosing spondylitis of multiple sites in spine: Secondary | ICD-10-CM | POA: Diagnosis not present

## 2020-03-03 DIAGNOSIS — Z111 Encounter for screening for respiratory tuberculosis: Secondary | ICD-10-CM | POA: Diagnosis not present

## 2020-03-03 DIAGNOSIS — Z6824 Body mass index (BMI) 24.0-24.9, adult: Secondary | ICD-10-CM | POA: Diagnosis not present

## 2020-03-03 DIAGNOSIS — H209 Unspecified iridocyclitis: Secondary | ICD-10-CM | POA: Diagnosis not present

## 2020-06-07 DIAGNOSIS — J309 Allergic rhinitis, unspecified: Secondary | ICD-10-CM | POA: Diagnosis not present

## 2020-06-07 DIAGNOSIS — H6590 Unspecified nonsuppurative otitis media, unspecified ear: Secondary | ICD-10-CM | POA: Diagnosis not present

## 2020-06-22 DIAGNOSIS — R300 Dysuria: Secondary | ICD-10-CM | POA: Diagnosis not present

## 2020-07-19 DIAGNOSIS — T148XXA Other injury of unspecified body region, initial encounter: Secondary | ICD-10-CM | POA: Diagnosis not present

## 2020-07-19 DIAGNOSIS — R300 Dysuria: Secondary | ICD-10-CM | POA: Diagnosis not present

## 2020-08-03 DIAGNOSIS — Z13220 Encounter for screening for lipoid disorders: Secondary | ICD-10-CM | POA: Diagnosis not present

## 2020-08-03 DIAGNOSIS — R251 Tremor, unspecified: Secondary | ICD-10-CM | POA: Diagnosis not present

## 2020-08-03 DIAGNOSIS — R0989 Other specified symptoms and signs involving the circulatory and respiratory systems: Secondary | ICD-10-CM | POA: Diagnosis not present

## 2020-08-06 DIAGNOSIS — I6523 Occlusion and stenosis of bilateral carotid arteries: Secondary | ICD-10-CM | POA: Diagnosis not present

## 2020-08-06 DIAGNOSIS — R0989 Other specified symptoms and signs involving the circulatory and respiratory systems: Secondary | ICD-10-CM | POA: Diagnosis not present

## 2020-08-09 DIAGNOSIS — R300 Dysuria: Secondary | ICD-10-CM | POA: Diagnosis not present

## 2020-08-09 DIAGNOSIS — N400 Enlarged prostate without lower urinary tract symptoms: Secondary | ICD-10-CM | POA: Diagnosis not present

## 2020-08-09 DIAGNOSIS — Z87442 Personal history of urinary calculi: Secondary | ICD-10-CM | POA: Diagnosis not present

## 2020-08-09 DIAGNOSIS — N200 Calculus of kidney: Secondary | ICD-10-CM | POA: Diagnosis not present

## 2020-09-02 DIAGNOSIS — H209 Unspecified iridocyclitis: Secondary | ICD-10-CM | POA: Diagnosis not present

## 2020-09-02 DIAGNOSIS — M450 Ankylosing spondylitis of multiple sites in spine: Secondary | ICD-10-CM | POA: Diagnosis not present

## 2020-09-02 DIAGNOSIS — Z79899 Other long term (current) drug therapy: Secondary | ICD-10-CM | POA: Diagnosis not present

## 2020-09-24 DIAGNOSIS — R6883 Chills (without fever): Secondary | ICD-10-CM | POA: Diagnosis not present

## 2020-09-24 DIAGNOSIS — R10814 Left lower quadrant abdominal tenderness: Secondary | ICD-10-CM | POA: Diagnosis not present

## 2020-10-04 ENCOUNTER — Other Ambulatory Visit: Payer: Self-pay | Admitting: Physician Assistant

## 2020-10-04 ENCOUNTER — Ambulatory Visit
Admission: RE | Admit: 2020-10-04 | Discharge: 2020-10-04 | Disposition: A | Discharge status: Home / Self Care | Payer: BC Managed Care – PPO | Source: Ambulatory Visit | Attending: Physician Assistant | Admitting: Physician Assistant

## 2020-10-04 ENCOUNTER — Other Ambulatory Visit: Payer: Self-pay

## 2020-10-04 DIAGNOSIS — R102 Pelvic and perineal pain: Secondary | ICD-10-CM

## 2020-10-04 DIAGNOSIS — Z8719 Personal history of other diseases of the digestive system: Secondary | ICD-10-CM

## 2020-10-04 DIAGNOSIS — R10811 Right upper quadrant abdominal tenderness: Secondary | ICD-10-CM

## 2020-10-04 DIAGNOSIS — N200 Calculus of kidney: Secondary | ICD-10-CM | POA: Diagnosis not present

## 2020-10-04 DIAGNOSIS — R10814 Left lower quadrant abdominal tenderness: Secondary | ICD-10-CM

## 2020-10-04 MED ORDER — IOPAMIDOL (ISOVUE-300) INJECTION 61%
100.0000 mL | Freq: Once | INTRAVENOUS | Status: AC | PRN
  Administered 2020-10-04: 100 mL via INTRAVENOUS

## 2020-10-25 DIAGNOSIS — R1084 Generalized abdominal pain: Secondary | ICD-10-CM | POA: Diagnosis not present

## 2020-10-25 DIAGNOSIS — M450 Ankylosing spondylitis of multiple sites in spine: Secondary | ICD-10-CM | POA: Diagnosis not present

## 2020-10-25 DIAGNOSIS — R198 Other specified symptoms and signs involving the digestive system and abdomen: Secondary | ICD-10-CM | POA: Diagnosis not present

## 2020-10-25 DIAGNOSIS — Z0000 Encounter for general adult medical examination without abnormal findings: Secondary | ICD-10-CM | POA: Diagnosis not present

## 2020-10-25 DIAGNOSIS — Z8719 Personal history of other diseases of the digestive system: Secondary | ICD-10-CM | POA: Diagnosis not present

## 2020-10-25 DIAGNOSIS — Z1159 Encounter for screening for other viral diseases: Secondary | ICD-10-CM | POA: Diagnosis not present

## 2020-10-28 DIAGNOSIS — M9903 Segmental and somatic dysfunction of lumbar region: Secondary | ICD-10-CM | POA: Diagnosis not present

## 2020-10-28 DIAGNOSIS — M9901 Segmental and somatic dysfunction of cervical region: Secondary | ICD-10-CM | POA: Diagnosis not present

## 2020-10-28 DIAGNOSIS — M9902 Segmental and somatic dysfunction of thoracic region: Secondary | ICD-10-CM | POA: Diagnosis not present

## 2020-11-01 DIAGNOSIS — M5417 Radiculopathy, lumbosacral region: Secondary | ICD-10-CM | POA: Diagnosis not present

## 2020-11-01 DIAGNOSIS — M9903 Segmental and somatic dysfunction of lumbar region: Secondary | ICD-10-CM | POA: Diagnosis not present

## 2020-11-01 DIAGNOSIS — M5137 Other intervertebral disc degeneration, lumbosacral region: Secondary | ICD-10-CM | POA: Diagnosis not present

## 2020-11-01 DIAGNOSIS — M5386 Other specified dorsopathies, lumbar region: Secondary | ICD-10-CM | POA: Diagnosis not present

## 2020-11-05 DIAGNOSIS — M5417 Radiculopathy, lumbosacral region: Secondary | ICD-10-CM | POA: Diagnosis not present

## 2020-11-05 DIAGNOSIS — M9903 Segmental and somatic dysfunction of lumbar region: Secondary | ICD-10-CM | POA: Diagnosis not present

## 2020-11-05 DIAGNOSIS — M5137 Other intervertebral disc degeneration, lumbosacral region: Secondary | ICD-10-CM | POA: Diagnosis not present

## 2020-11-05 DIAGNOSIS — M5386 Other specified dorsopathies, lumbar region: Secondary | ICD-10-CM | POA: Diagnosis not present

## 2020-11-10 DIAGNOSIS — M5417 Radiculopathy, lumbosacral region: Secondary | ICD-10-CM | POA: Diagnosis not present

## 2020-11-10 DIAGNOSIS — M5386 Other specified dorsopathies, lumbar region: Secondary | ICD-10-CM | POA: Diagnosis not present

## 2020-11-10 DIAGNOSIS — M5137 Other intervertebral disc degeneration, lumbosacral region: Secondary | ICD-10-CM | POA: Diagnosis not present

## 2020-11-10 DIAGNOSIS — M9903 Segmental and somatic dysfunction of lumbar region: Secondary | ICD-10-CM | POA: Diagnosis not present

## 2020-11-11 DIAGNOSIS — F321 Major depressive disorder, single episode, moderate: Secondary | ICD-10-CM | POA: Diagnosis not present

## 2020-11-15 DIAGNOSIS — M5137 Other intervertebral disc degeneration, lumbosacral region: Secondary | ICD-10-CM | POA: Diagnosis not present

## 2020-11-15 DIAGNOSIS — M5386 Other specified dorsopathies, lumbar region: Secondary | ICD-10-CM | POA: Diagnosis not present

## 2020-11-15 DIAGNOSIS — M5417 Radiculopathy, lumbosacral region: Secondary | ICD-10-CM | POA: Diagnosis not present

## 2020-11-15 DIAGNOSIS — M9903 Segmental and somatic dysfunction of lumbar region: Secondary | ICD-10-CM | POA: Diagnosis not present

## 2020-11-17 DIAGNOSIS — F321 Major depressive disorder, single episode, moderate: Secondary | ICD-10-CM | POA: Diagnosis not present

## 2020-11-22 DIAGNOSIS — M5386 Other specified dorsopathies, lumbar region: Secondary | ICD-10-CM | POA: Diagnosis not present

## 2020-11-22 DIAGNOSIS — M5137 Other intervertebral disc degeneration, lumbosacral region: Secondary | ICD-10-CM | POA: Diagnosis not present

## 2020-11-22 DIAGNOSIS — M9903 Segmental and somatic dysfunction of lumbar region: Secondary | ICD-10-CM | POA: Diagnosis not present

## 2020-11-22 DIAGNOSIS — M5417 Radiculopathy, lumbosacral region: Secondary | ICD-10-CM | POA: Diagnosis not present

## 2020-11-29 DIAGNOSIS — M9903 Segmental and somatic dysfunction of lumbar region: Secondary | ICD-10-CM | POA: Diagnosis not present

## 2020-11-29 DIAGNOSIS — M5386 Other specified dorsopathies, lumbar region: Secondary | ICD-10-CM | POA: Diagnosis not present

## 2020-11-29 DIAGNOSIS — M5137 Other intervertebral disc degeneration, lumbosacral region: Secondary | ICD-10-CM | POA: Diagnosis not present

## 2020-11-29 DIAGNOSIS — M5417 Radiculopathy, lumbosacral region: Secondary | ICD-10-CM | POA: Diagnosis not present

## 2020-12-02 DIAGNOSIS — F321 Major depressive disorder, single episode, moderate: Secondary | ICD-10-CM | POA: Diagnosis not present

## 2020-12-07 DIAGNOSIS — M5137 Other intervertebral disc degeneration, lumbosacral region: Secondary | ICD-10-CM | POA: Diagnosis not present

## 2020-12-07 DIAGNOSIS — M5417 Radiculopathy, lumbosacral region: Secondary | ICD-10-CM | POA: Diagnosis not present

## 2020-12-07 DIAGNOSIS — M9903 Segmental and somatic dysfunction of lumbar region: Secondary | ICD-10-CM | POA: Diagnosis not present

## 2020-12-07 DIAGNOSIS — M5386 Other specified dorsopathies, lumbar region: Secondary | ICD-10-CM | POA: Diagnosis not present

## 2020-12-22 DIAGNOSIS — M5137 Other intervertebral disc degeneration, lumbosacral region: Secondary | ICD-10-CM | POA: Diagnosis not present

## 2020-12-22 DIAGNOSIS — M9903 Segmental and somatic dysfunction of lumbar region: Secondary | ICD-10-CM | POA: Diagnosis not present

## 2020-12-22 DIAGNOSIS — M5386 Other specified dorsopathies, lumbar region: Secondary | ICD-10-CM | POA: Diagnosis not present

## 2020-12-22 DIAGNOSIS — M5417 Radiculopathy, lumbosacral region: Secondary | ICD-10-CM | POA: Diagnosis not present

## 2021-01-04 DIAGNOSIS — M5417 Radiculopathy, lumbosacral region: Secondary | ICD-10-CM | POA: Diagnosis not present

## 2021-01-04 DIAGNOSIS — M5386 Other specified dorsopathies, lumbar region: Secondary | ICD-10-CM | POA: Diagnosis not present

## 2021-01-04 DIAGNOSIS — M9903 Segmental and somatic dysfunction of lumbar region: Secondary | ICD-10-CM | POA: Diagnosis not present

## 2021-01-04 DIAGNOSIS — M5137 Other intervertebral disc degeneration, lumbosacral region: Secondary | ICD-10-CM | POA: Diagnosis not present

## 2021-01-27 DIAGNOSIS — M9903 Segmental and somatic dysfunction of lumbar region: Secondary | ICD-10-CM | POA: Diagnosis not present

## 2021-01-27 DIAGNOSIS — M5137 Other intervertebral disc degeneration, lumbosacral region: Secondary | ICD-10-CM | POA: Diagnosis not present

## 2021-01-27 DIAGNOSIS — M5386 Other specified dorsopathies, lumbar region: Secondary | ICD-10-CM | POA: Diagnosis not present

## 2021-01-27 DIAGNOSIS — M5417 Radiculopathy, lumbosacral region: Secondary | ICD-10-CM | POA: Diagnosis not present

## 2021-02-10 DIAGNOSIS — M5417 Radiculopathy, lumbosacral region: Secondary | ICD-10-CM | POA: Diagnosis not present

## 2021-02-10 DIAGNOSIS — M5137 Other intervertebral disc degeneration, lumbosacral region: Secondary | ICD-10-CM | POA: Diagnosis not present

## 2021-02-10 DIAGNOSIS — M5386 Other specified dorsopathies, lumbar region: Secondary | ICD-10-CM | POA: Diagnosis not present

## 2021-02-10 DIAGNOSIS — M9903 Segmental and somatic dysfunction of lumbar region: Secondary | ICD-10-CM | POA: Diagnosis not present

## 2021-02-24 DIAGNOSIS — M5137 Other intervertebral disc degeneration, lumbosacral region: Secondary | ICD-10-CM | POA: Diagnosis not present

## 2021-02-24 DIAGNOSIS — M5386 Other specified dorsopathies, lumbar region: Secondary | ICD-10-CM | POA: Diagnosis not present

## 2021-02-24 DIAGNOSIS — M9903 Segmental and somatic dysfunction of lumbar region: Secondary | ICD-10-CM | POA: Diagnosis not present

## 2021-02-24 DIAGNOSIS — M5417 Radiculopathy, lumbosacral region: Secondary | ICD-10-CM | POA: Diagnosis not present

## 2021-03-07 DIAGNOSIS — Z79899 Other long term (current) drug therapy: Secondary | ICD-10-CM | POA: Diagnosis not present

## 2021-03-07 DIAGNOSIS — H209 Unspecified iridocyclitis: Secondary | ICD-10-CM | POA: Diagnosis not present

## 2021-03-07 DIAGNOSIS — M450 Ankylosing spondylitis of multiple sites in spine: Secondary | ICD-10-CM | POA: Diagnosis not present

## 2021-03-11 DIAGNOSIS — M9903 Segmental and somatic dysfunction of lumbar region: Secondary | ICD-10-CM | POA: Diagnosis not present

## 2021-03-11 DIAGNOSIS — M5417 Radiculopathy, lumbosacral region: Secondary | ICD-10-CM | POA: Diagnosis not present

## 2021-03-11 DIAGNOSIS — M5137 Other intervertebral disc degeneration, lumbosacral region: Secondary | ICD-10-CM | POA: Diagnosis not present

## 2021-03-11 DIAGNOSIS — M5386 Other specified dorsopathies, lumbar region: Secondary | ICD-10-CM | POA: Diagnosis not present

## 2021-03-30 DIAGNOSIS — M5137 Other intervertebral disc degeneration, lumbosacral region: Secondary | ICD-10-CM | POA: Diagnosis not present

## 2021-03-30 DIAGNOSIS — M5417 Radiculopathy, lumbosacral region: Secondary | ICD-10-CM | POA: Diagnosis not present

## 2021-03-30 DIAGNOSIS — M5386 Other specified dorsopathies, lumbar region: Secondary | ICD-10-CM | POA: Diagnosis not present

## 2021-03-30 DIAGNOSIS — M9903 Segmental and somatic dysfunction of lumbar region: Secondary | ICD-10-CM | POA: Diagnosis not present

## 2021-04-20 DIAGNOSIS — M5137 Other intervertebral disc degeneration, lumbosacral region: Secondary | ICD-10-CM | POA: Diagnosis not present

## 2021-04-20 DIAGNOSIS — M9903 Segmental and somatic dysfunction of lumbar region: Secondary | ICD-10-CM | POA: Diagnosis not present

## 2021-04-20 DIAGNOSIS — M5386 Other specified dorsopathies, lumbar region: Secondary | ICD-10-CM | POA: Diagnosis not present

## 2021-04-20 DIAGNOSIS — M5417 Radiculopathy, lumbosacral region: Secondary | ICD-10-CM | POA: Diagnosis not present

## 2021-05-04 DIAGNOSIS — M5417 Radiculopathy, lumbosacral region: Secondary | ICD-10-CM | POA: Diagnosis not present

## 2021-05-04 DIAGNOSIS — M5386 Other specified dorsopathies, lumbar region: Secondary | ICD-10-CM | POA: Diagnosis not present

## 2021-05-04 DIAGNOSIS — M9903 Segmental and somatic dysfunction of lumbar region: Secondary | ICD-10-CM | POA: Diagnosis not present

## 2021-05-04 DIAGNOSIS — M5137 Other intervertebral disc degeneration, lumbosacral region: Secondary | ICD-10-CM | POA: Diagnosis not present

## 2021-06-01 IMAGING — CT CT ABD-PELV W/ CM
1 of 3 series · 13 of 32 positions shown, 19 images · IV contrast (APPLIED)
Comparison: 12/15/2015

CLINICAL DATA: Left lower quadrant and right upper quadrant pain.
Suprapubic pain.

EXAM:
CT ABDOMEN AND PELVIS WITH CONTRAST
TECHNIQUE: Multidetector CT imaging of the abdomen and pelvis was performed
using the standard protocol following bolus administration of
intravenous contrast.
CONTRAST:  100mL [id number] IOPAMIDOL ([id number]) INJECTION 61%

[Series 2: abd/pelvis w/cm · axial · 0.73mm/px · z∈[-477,-97]mm · 13 of 88 slices shown, 19 images]
[im 6/88  soft-tissue]
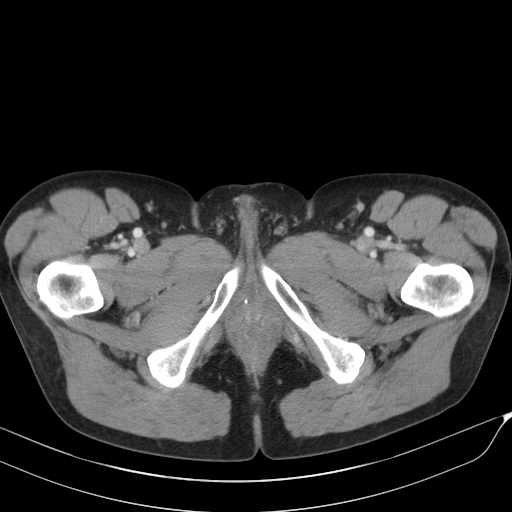
[im 6/88  bone]
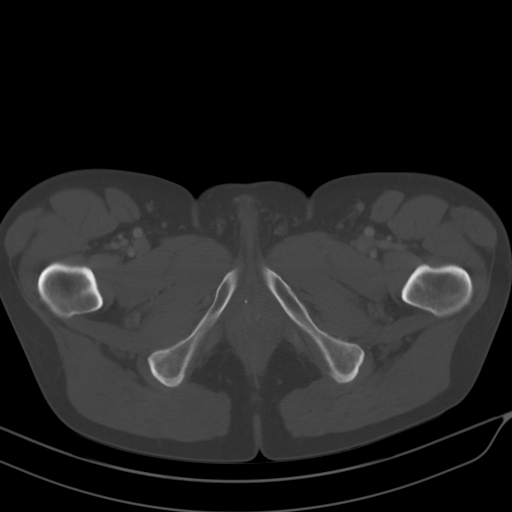
[im 12/88  soft-tissue]
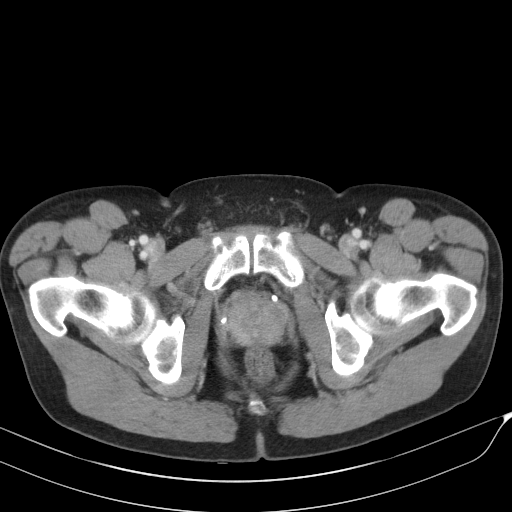
[im 18/88  soft-tissue]
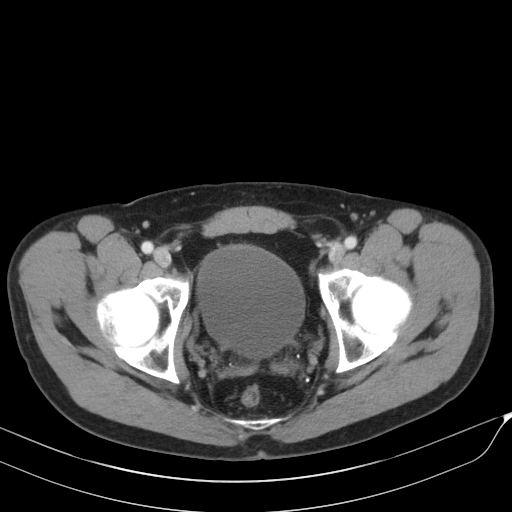
[im 24/88  soft-tissue]
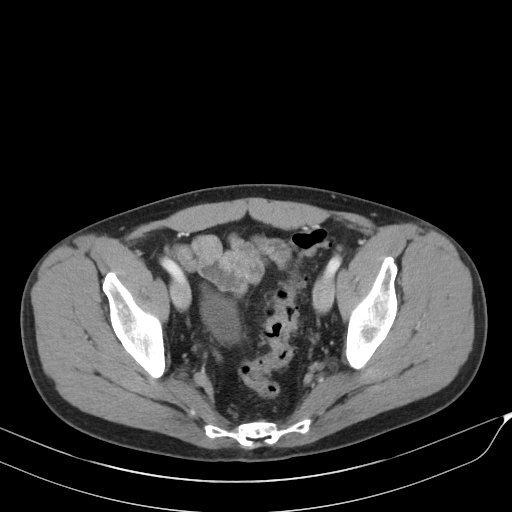
[im 30/88  soft-tissue]
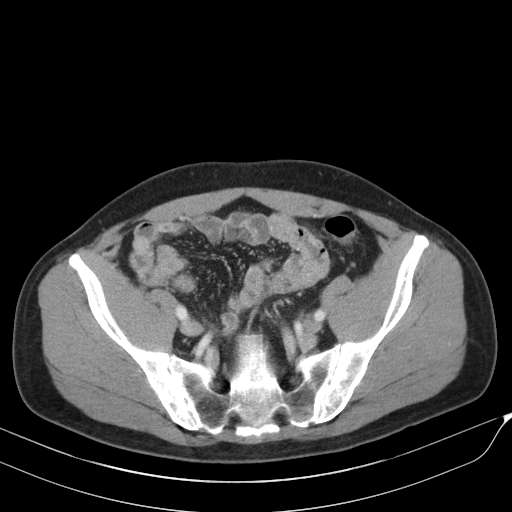
[im 35/88  soft-tissue]
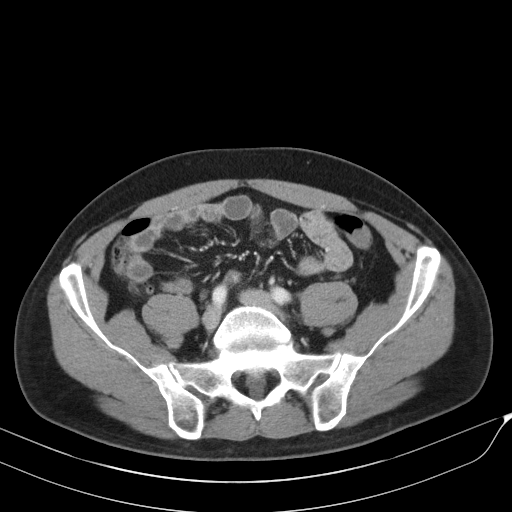
[im 47/88  soft-tissue]
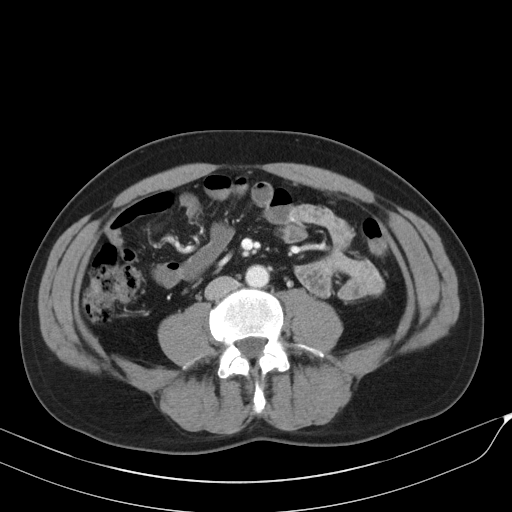
[im 53/88  soft-tissue]
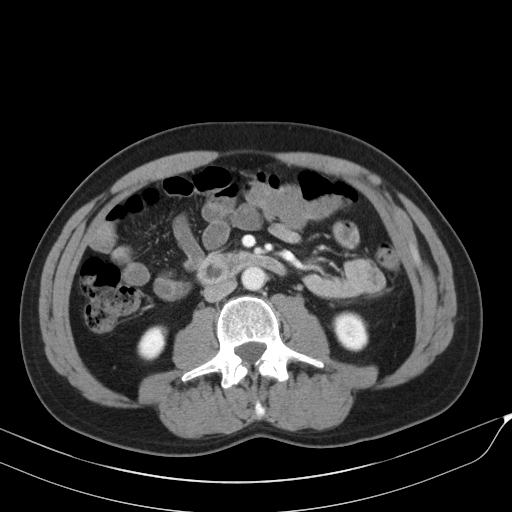
[im 59/88  soft-tissue]
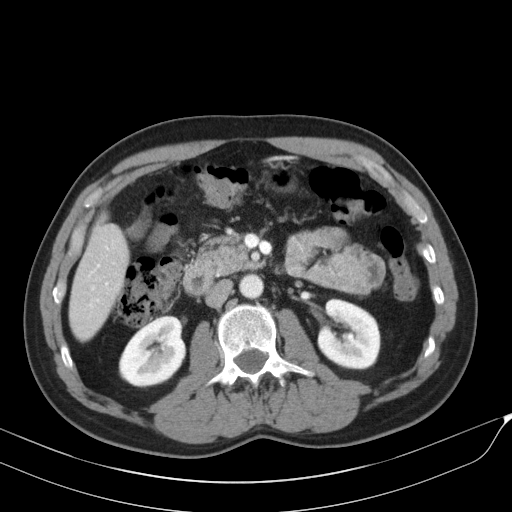
[im 59/88  bone]
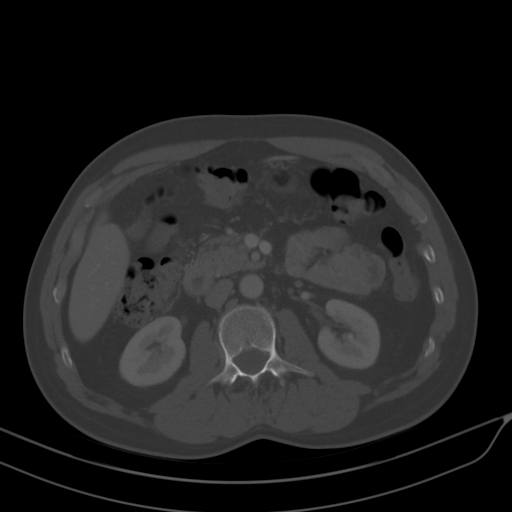
[im 64/88  soft-tissue]
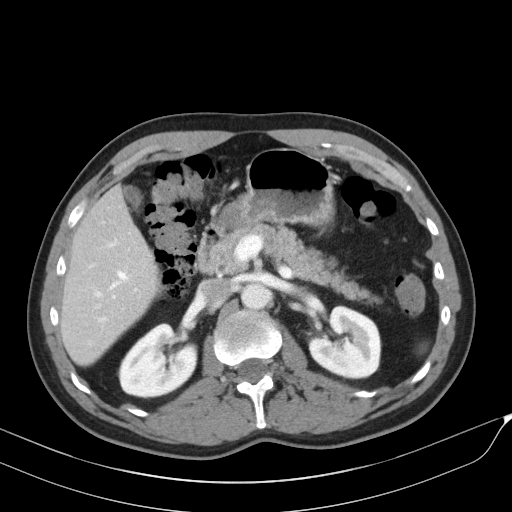
[im 64/88  lung]
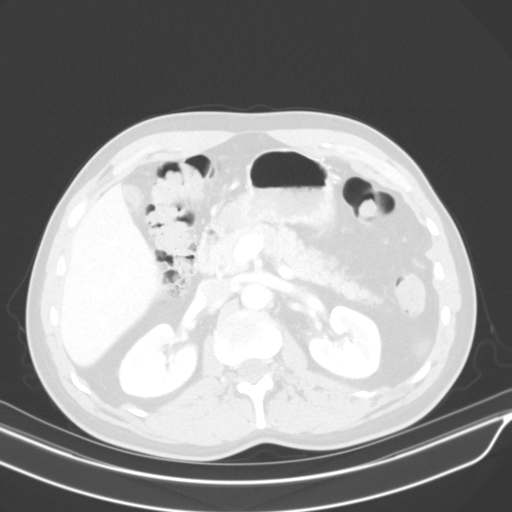
[im 70/88  soft-tissue]
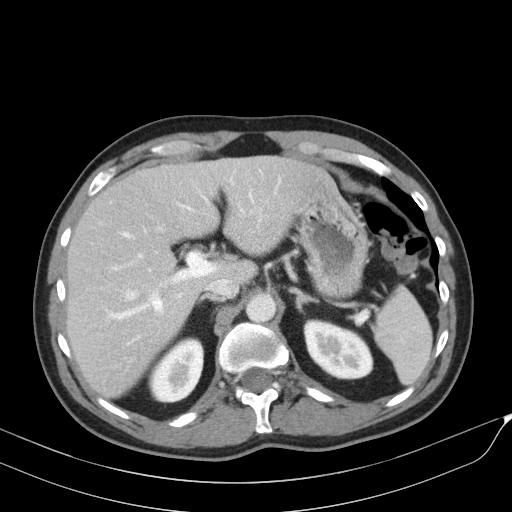
[im 70/88  lung]
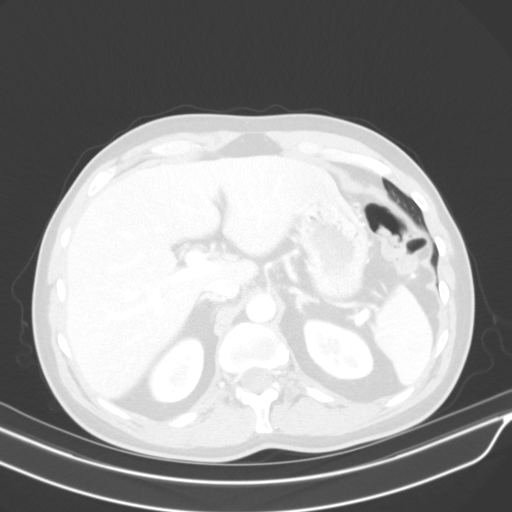
[im 76/88  soft-tissue]
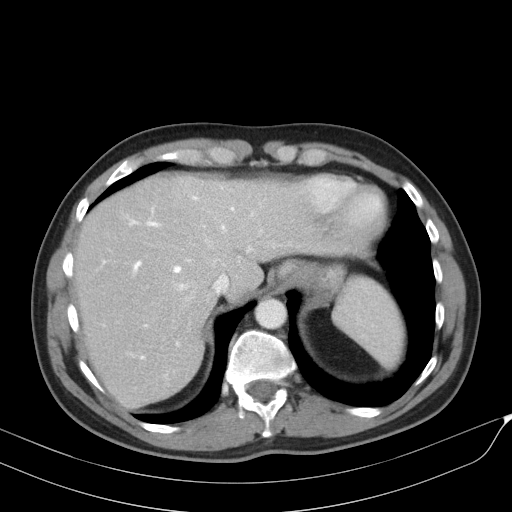
[im 76/88  lung]
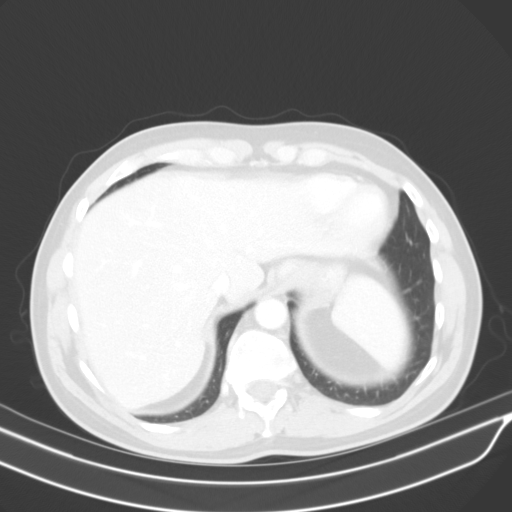
[im 82/88  soft-tissue]
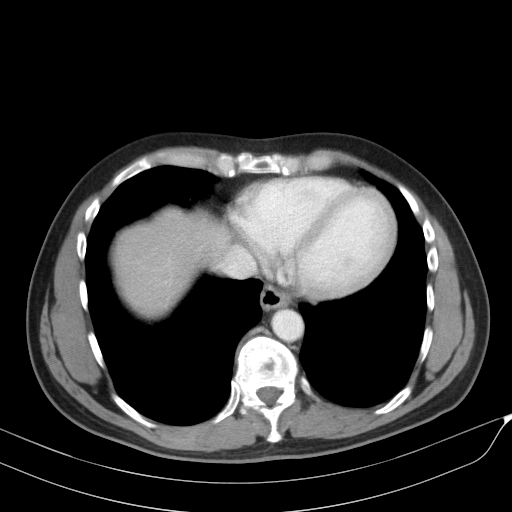
[im 82/88  lung]
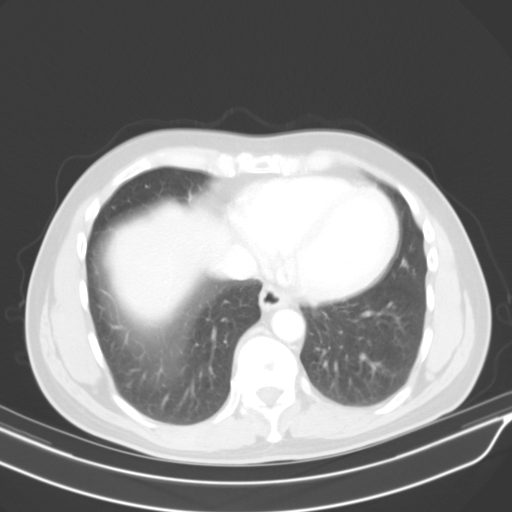

[13 of 32 positions shown; findings below may reference images not displayed]

FINDINGS: Lower chest: Unremarkable.

Hepatobiliary: No suspicious focal abnormality within the liver
parenchyma. There is no evidence for gallstones, gallbladder wall
thickening, or pericholecystic fluid. No intrahepatic or
extrahepatic biliary dilation.

Pancreas: No focal mass lesion. No dilatation of the main duct. No
intraparenchymal cyst. No peripancreatic edema.

Spleen: No splenomegaly. No focal mass lesion.

Adrenals/Urinary Tract: No adrenal nodule or mass. 3 mm
nonobstructing stone noted right kidney. Left kidney unremarkable.
No evidence for hydroureter. The urinary bladder appears normal for
the degree of distention.

Stomach/Bowel: Stomach is unremarkable. No gastric wall thickening.
No evidence of outlet obstruction. Duodenum is normally positioned
as is the ligament of Treitz. No small bowel wall thickening. No
small bowel dilatation. The terminal ileum is normal. The appendix
is normal. No gross colonic mass. No colonic wall thickening.

Vascular/Lymphatic: No abdominal aortic aneurysm. No abdominal
aortic atherosclerotic calcification. There is no gastrohepatic or
hepatoduodenal ligament lymphadenopathy. No retroperitoneal or
mesenteric lymphadenopathy. No pelvic sidewall lymphadenopathy.

Reproductive: The prostate gland and seminal vesicles are
unremarkable.

Other: No intraperitoneal free fluid.

Musculoskeletal: No worrisome lytic or sclerotic osseous
abnormality. Bilateral pars interarticularis defects are noted at
L5.
IMPRESSION: 1. No acute findings in the abdomen or pelvis. No findings to
explain the patient's history of pain. Specifically, no imaging
features of diverticulitis.

## 2021-06-15 DIAGNOSIS — M5417 Radiculopathy, lumbosacral region: Secondary | ICD-10-CM | POA: Diagnosis not present

## 2021-06-15 DIAGNOSIS — M9903 Segmental and somatic dysfunction of lumbar region: Secondary | ICD-10-CM | POA: Diagnosis not present

## 2021-06-15 DIAGNOSIS — M5137 Other intervertebral disc degeneration, lumbosacral region: Secondary | ICD-10-CM | POA: Diagnosis not present

## 2021-06-15 DIAGNOSIS — M5386 Other specified dorsopathies, lumbar region: Secondary | ICD-10-CM | POA: Diagnosis not present

## 2021-06-29 DIAGNOSIS — M9903 Segmental and somatic dysfunction of lumbar region: Secondary | ICD-10-CM | POA: Diagnosis not present

## 2021-06-29 DIAGNOSIS — M5386 Other specified dorsopathies, lumbar region: Secondary | ICD-10-CM | POA: Diagnosis not present

## 2021-06-29 DIAGNOSIS — M5137 Other intervertebral disc degeneration, lumbosacral region: Secondary | ICD-10-CM | POA: Diagnosis not present

## 2021-06-29 DIAGNOSIS — M5417 Radiculopathy, lumbosacral region: Secondary | ICD-10-CM | POA: Diagnosis not present

## 2021-07-13 DIAGNOSIS — M5417 Radiculopathy, lumbosacral region: Secondary | ICD-10-CM | POA: Diagnosis not present

## 2021-07-13 DIAGNOSIS — M9903 Segmental and somatic dysfunction of lumbar region: Secondary | ICD-10-CM | POA: Diagnosis not present

## 2021-07-13 DIAGNOSIS — M5386 Other specified dorsopathies, lumbar region: Secondary | ICD-10-CM | POA: Diagnosis not present

## 2021-07-13 DIAGNOSIS — M5137 Other intervertebral disc degeneration, lumbosacral region: Secondary | ICD-10-CM | POA: Diagnosis not present

## 2021-07-27 DIAGNOSIS — M9903 Segmental and somatic dysfunction of lumbar region: Secondary | ICD-10-CM | POA: Diagnosis not present

## 2021-07-27 DIAGNOSIS — M5386 Other specified dorsopathies, lumbar region: Secondary | ICD-10-CM | POA: Diagnosis not present

## 2021-07-27 DIAGNOSIS — M5137 Other intervertebral disc degeneration, lumbosacral region: Secondary | ICD-10-CM | POA: Diagnosis not present

## 2021-07-27 DIAGNOSIS — M5417 Radiculopathy, lumbosacral region: Secondary | ICD-10-CM | POA: Diagnosis not present

## 2021-08-30 DIAGNOSIS — H209 Unspecified iridocyclitis: Secondary | ICD-10-CM | POA: Diagnosis not present

## 2021-08-30 DIAGNOSIS — M450 Ankylosing spondylitis of multiple sites in spine: Secondary | ICD-10-CM | POA: Diagnosis not present

## 2021-08-30 DIAGNOSIS — Z79899 Other long term (current) drug therapy: Secondary | ICD-10-CM | POA: Diagnosis not present

## 2022-02-28 DIAGNOSIS — Z79899 Other long term (current) drug therapy: Secondary | ICD-10-CM | POA: Diagnosis not present

## 2022-02-28 DIAGNOSIS — M450 Ankylosing spondylitis of multiple sites in spine: Secondary | ICD-10-CM | POA: Diagnosis not present

## 2022-02-28 DIAGNOSIS — H209 Unspecified iridocyclitis: Secondary | ICD-10-CM | POA: Diagnosis not present

## 2022-03-21 ENCOUNTER — Other Ambulatory Visit: Payer: Self-pay | Admitting: Internal Medicine

## 2022-03-21 DIAGNOSIS — R1031 Right lower quadrant pain: Secondary | ICD-10-CM

## 2022-03-27 ENCOUNTER — Ambulatory Visit
Admission: RE | Admit: 2022-03-27 | Discharge: 2022-03-27 | Disposition: A | Discharge status: Home / Self Care | Payer: BC Managed Care – PPO | Source: Ambulatory Visit | Attending: Internal Medicine | Admitting: Internal Medicine

## 2022-03-27 DIAGNOSIS — R1031 Right lower quadrant pain: Secondary | ICD-10-CM

## 2022-03-27 DIAGNOSIS — R1030 Lower abdominal pain, unspecified: Secondary | ICD-10-CM | POA: Diagnosis not present

## 2022-03-31 ENCOUNTER — Other Ambulatory Visit: Payer: Self-pay | Admitting: Internal Medicine

## 2022-03-31 DIAGNOSIS — R1031 Right lower quadrant pain: Secondary | ICD-10-CM

## 2022-04-06 ENCOUNTER — Ambulatory Visit
Admission: RE | Admit: 2022-04-06 | Discharge: 2022-04-06 | Disposition: A | Discharge status: Home / Self Care | Payer: BC Managed Care – PPO | Source: Ambulatory Visit | Attending: Internal Medicine | Admitting: Internal Medicine

## 2022-04-06 DIAGNOSIS — N200 Calculus of kidney: Secondary | ICD-10-CM | POA: Diagnosis not present

## 2022-04-06 DIAGNOSIS — K4090 Unilateral inguinal hernia, without obstruction or gangrene, not specified as recurrent: Secondary | ICD-10-CM | POA: Diagnosis not present

## 2022-04-06 DIAGNOSIS — K5730 Diverticulosis of large intestine without perforation or abscess without bleeding: Secondary | ICD-10-CM | POA: Diagnosis not present

## 2022-04-06 DIAGNOSIS — K760 Fatty (change of) liver, not elsewhere classified: Secondary | ICD-10-CM | POA: Diagnosis not present

## 2022-04-06 DIAGNOSIS — R1031 Right lower quadrant pain: Secondary | ICD-10-CM

## 2022-04-06 MED ORDER — IOPAMIDOL (ISOVUE-300) INJECTION 61%
100.0000 mL | Freq: Once | INTRAVENOUS | Status: AC | PRN
  Administered 2022-04-06: 100 mL via INTRAVENOUS

## 2022-04-27 DEATH — deceased

## 2022-06-13 ENCOUNTER — Ambulatory Visit
Admission: RE | Admit: 2022-06-13 | Discharge: 2022-06-13 | Disposition: A | Discharge status: Home / Self Care | Payer: BC Managed Care – PPO | Source: Ambulatory Visit | Attending: Physician Assistant | Admitting: Physician Assistant

## 2022-06-13 ENCOUNTER — Ambulatory Visit (INDEPENDENT_AMBULATORY_CARE_PROVIDER_SITE_OTHER): Payer: BC Managed Care – PPO

## 2022-06-13 VITALS — BP 130/84 | HR 63 | Temp 97.9°F | Resp 17

## 2022-06-13 DIAGNOSIS — S92535A Nondisplaced fracture of distal phalanx of left lesser toe(s), initial encounter for closed fracture: Secondary | ICD-10-CM

## 2022-06-13 DIAGNOSIS — S92512A Displaced fracture of proximal phalanx of left lesser toe(s), initial encounter for closed fracture: Secondary | ICD-10-CM | POA: Diagnosis not present

## 2022-06-13 NOTE — ED Triage Notes (Signed)
Pt is present today with left toe pain. Pt states that he hit his toe Sunday and now is experiencing  pain if baring weight on that foot.

## 2022-06-20 ENCOUNTER — Ambulatory Visit: Payer: BC Managed Care – PPO | Admitting: Orthopaedic Surgery

## 2022-06-21 NOTE — ED Provider Notes (Signed)
[facility]    CSN: [id number] Arrival date & time: 06/13/22  0915      History   Chief Complaint Chief Complaint  Patient presents with   Toe Injury    Toe of left foot appears to be broken. - Entered by patient    HPI Edwin Gilbert is a 51 y.o. male.   Pt complains of pain in his toe after hitting his toe.  Pt reports swelling and pain   The history is provided by the patient. No language interpreter was used.    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 Authorizing Provider  aspirin 325 MG tablet Take 325 mg by mouth daily. Taking 1/2 tablet QD    [provider]  celecoxib (CELEBREX) 200 MG capsule Take 200 mg by mouth 2 (two) times daily.    [provider]    Family History Family History  Problem Relation Age of Onset   Anuerysm Mother    Hypertension Father    Heart disease Father    Heart attack Father    Sleep apnea Brother    Migraines Brother     Social History Social History   Tobacco Use   Smoking status: Never   Smokeless tobacco: Never   Tobacco comments:    occ alcohol  Substance Use Topics   Alcohol use: Yes   Drug use: No     Allergies   Lactose  intolerance (gi)   Review of Systems Review of Systems  Musculoskeletal:  Positive for joint swelling and myalgias.  All other systems reviewed and are negative.    Physical Exam Triage Vital Signs ED Triage Vitals [06/13/22 0948]  Enc Vitals Group     BP 130/84     Pulse Rate 63     Resp 17     Temp 97.9 F (36.6 C)     Temp src      SpO2 97 %     Weight      Height      Head Circumference      Peak Flow      Pain Score 0     Pain Loc      Pain Edu?      Excl. in GC?    No data found.  Updated Vital Signs BP 130/84   Pulse 63   Temp 97.9 F (36.6 C)   Resp 17   SpO2 97%   Visual Acuity Right Eye Distance:   Left Eye Distance:   Bilateral Distance:    Right Eye Near:   Left Eye  Near:    Bilateral Near:     Physical Exam Vitals and nursing note reviewed.  Constitutional:      Appearance: He is well-developed.  HENT:     Head: Normocephalic.  Abdominal:     General: There is no distension.  Musculoskeletal:        General: Normal range of motion.  Skin:    General: Skin is warm.  Neurological:     General: No focal deficit present.     Mental Status: He is alert and oriented to person, place, and time.      UC Treatments / Results  Labs (all labs ordered are listed, but only abnormal results are displayed) Labs Reviewed - No data to display  EKG   Radiology No results found.  Procedures Procedures (including critical care time)  Medications Ordered in UC Medications - No data to display  Initial Impression / Assessment and Plan / UC Course  I have reviewed the triage vital signs and the nursing notes.  Pertinent labs & imaging results that were available during my care of the patient were reviewed by me and considered in my medical decision making (see chart for details).      Final Clinical Impressions(s) / UC Diagnoses   Final diagnoses:  Closed nondisplaced fracture of distal phalanx of lesser toe of left foot, initial  encounter   Discharge Instructions   None    ED Prescriptions   None    PDMP not reviewed this encounter. An After Visit Summary was printed and given to the patient.    Elson Areas, [place] 06/21/22 2347

## 2022-08-28 DIAGNOSIS — B359 Dermatophytosis, unspecified: Secondary | ICD-10-CM | POA: Diagnosis not present

## 2022-08-30 DIAGNOSIS — H209 Unspecified iridocyclitis: Secondary | ICD-10-CM | POA: Diagnosis not present

## 2022-08-30 DIAGNOSIS — M450 Ankylosing spondylitis of multiple sites in spine: Secondary | ICD-10-CM | POA: Diagnosis not present

## 2022-08-30 DIAGNOSIS — Z79899 Other long term (current) drug therapy: Secondary | ICD-10-CM | POA: Diagnosis not present

## 2022-09-08 DIAGNOSIS — R799 Abnormal finding of blood chemistry, unspecified: Secondary | ICD-10-CM | POA: Diagnosis not present

## 2022-10-18 DIAGNOSIS — Z113 Encounter for screening for infections with a predominantly sexual mode of transmission: Secondary | ICD-10-CM | POA: Diagnosis not present

## 2022-11-01 DIAGNOSIS — F411 Generalized anxiety disorder: Secondary | ICD-10-CM | POA: Diagnosis not present

## 2022-11-08 DIAGNOSIS — F411 Generalized anxiety disorder: Secondary | ICD-10-CM | POA: Diagnosis not present

## 2022-11-15 DIAGNOSIS — F411 Generalized anxiety disorder: Secondary | ICD-10-CM | POA: Diagnosis not present

## 2022-11-22 IMAGING — US US PELVIS LIMITED
1 series · 10 of 10 positions shown · non-contrast
Comparison: None.

CLINICAL DATA: Right inguinal pain

EXAM:
LIMITED ULTRASOUND OF PELVIS
TECHNIQUE: Limited transabdominal ultrasound examination of the pelvis was
performed.

[Series 1: us pelvis limited · 0.06mm/px · 10 acquisitions, 10 frames shown]
[im 1/10]
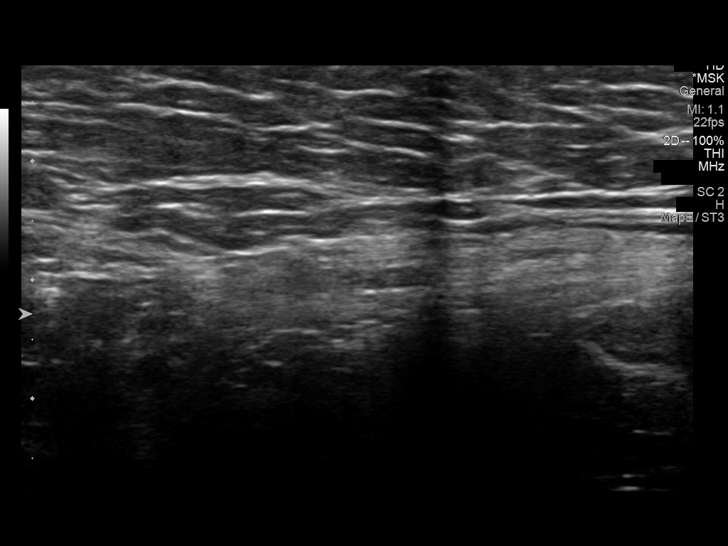
[im 2/10]
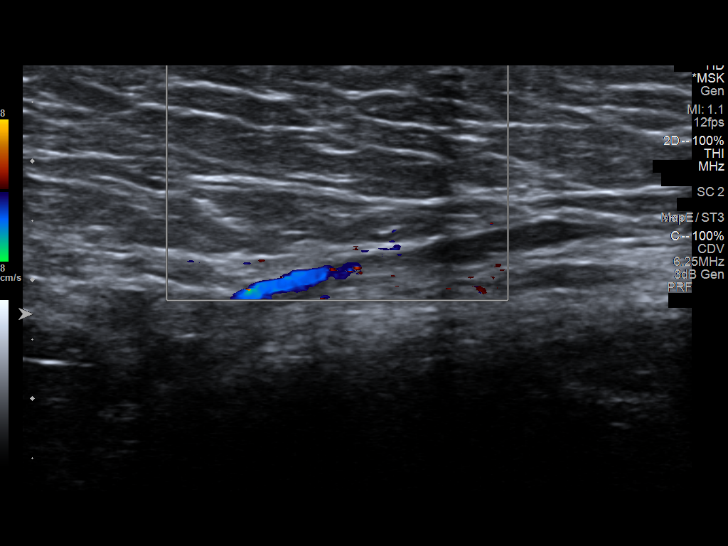
[im 3/10]
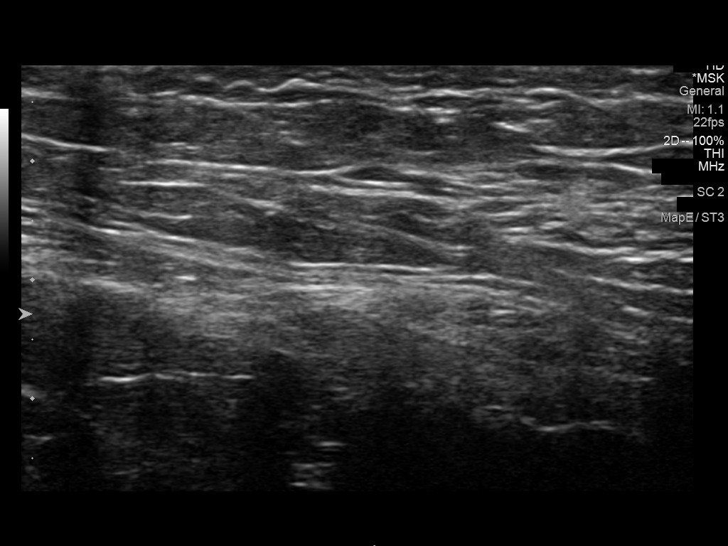
[im 4/10]
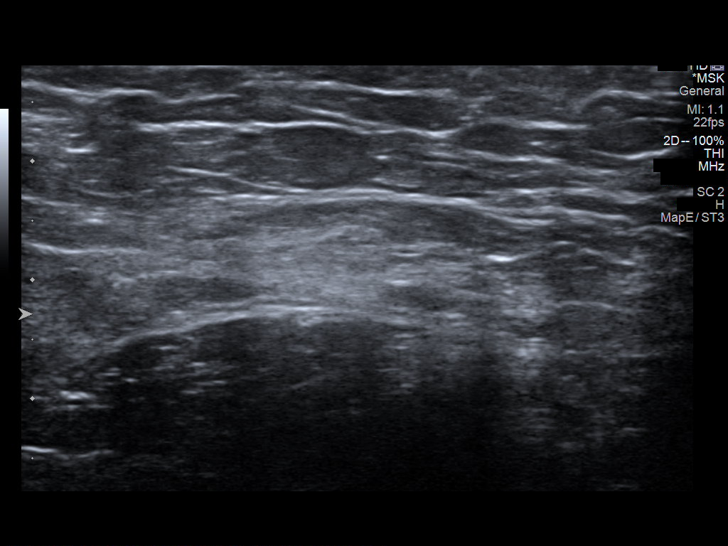
[im 5/10]
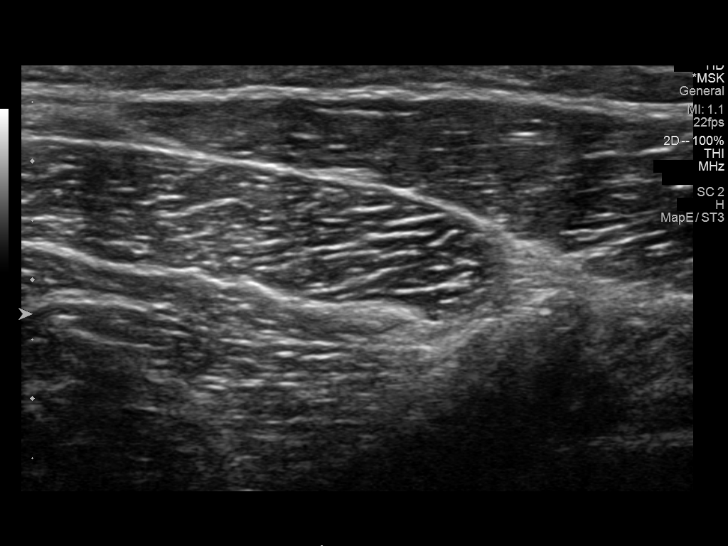
[im 6/10]
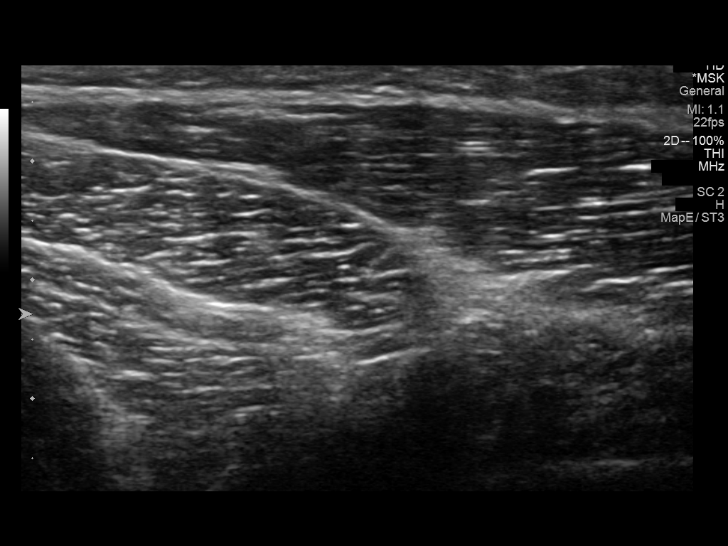
[im 7/10]
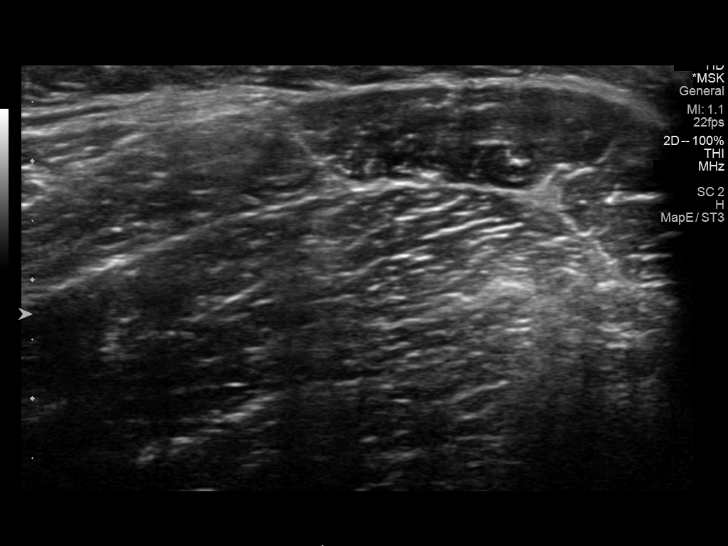
[im 8/10]
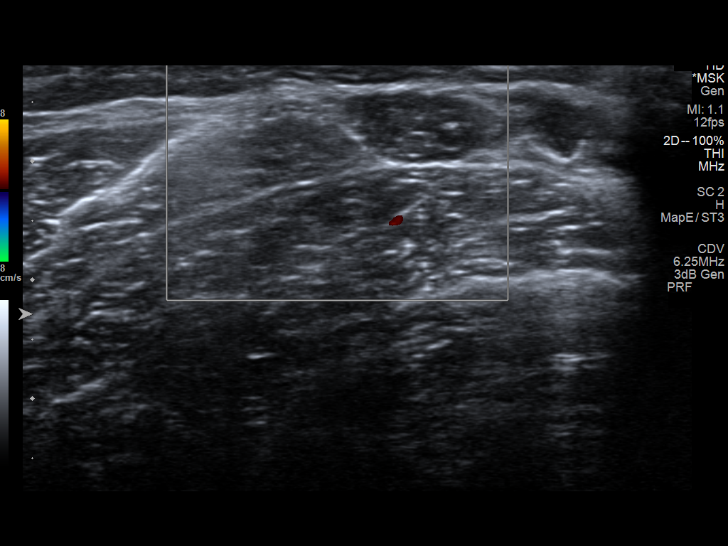
[im 9/10]
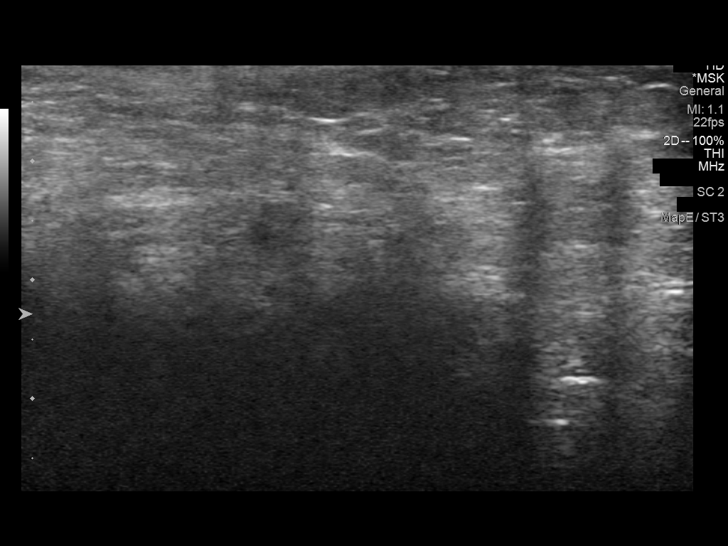
[im 10/10]
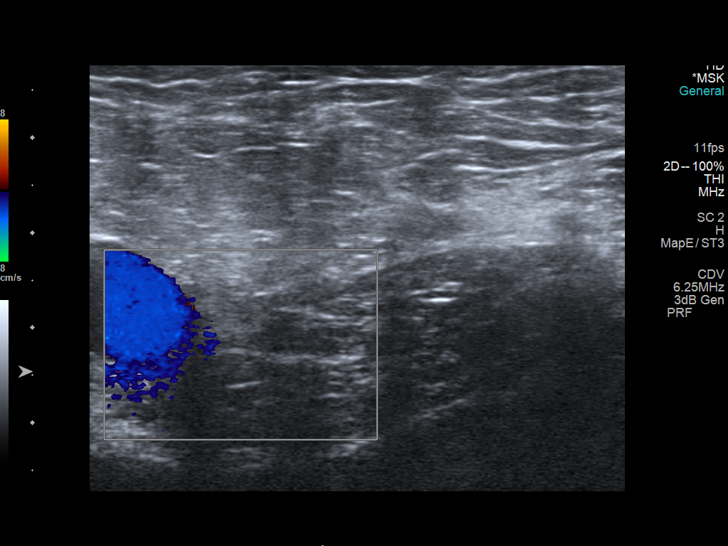

[10 of 10 positions shown; findings below may reference images not displayed]

FINDINGS: No sonographic abnormality is seen in the area of patient's symptoms
in the right inguinal region. There is no demonstrable solid nodule
or fluid collections. There is no demonstrable right inguinal
hernia.
IMPRESSION: No sonographic abnormality is seen in the right inguinal region.

## 2022-12-02 IMAGING — CT CT ABD-PELV W/ CM
1 of 3 series · 14 of 32 positions shown, 19 images · IV contrast (agent unspecified)
Comparison: CT dated October 04, 2020

CLINICAL DATA: RIGHT groin pain

EXAM:
CT ABDOMEN AND PELVIS WITH CONTRAST
TECHNIQUE: Multidetector CT imaging of the abdomen and pelvis was performed
using the standard protocol following bolus administration of
intravenous contrast.

[Series 2: a/p w/ 5mm · axial · 0.78mm/px · z∈[-458,-4]mm · 14 of 103 slices shown, 19 images]
[im 6/103  soft-tissue]
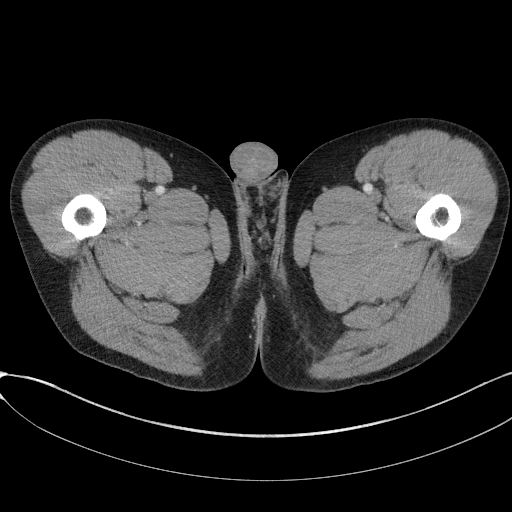
[im 6/103  bone]
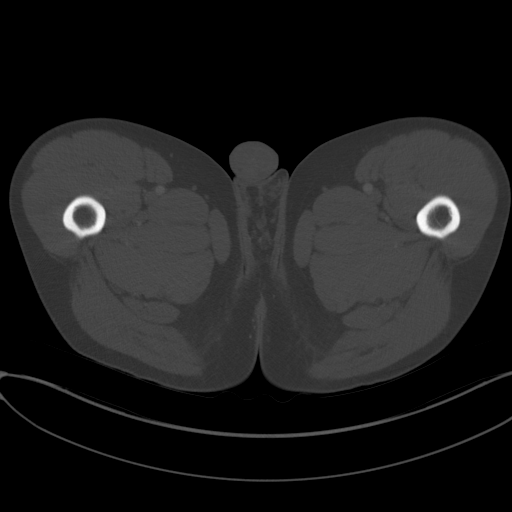
[im 17/103  soft-tissue]
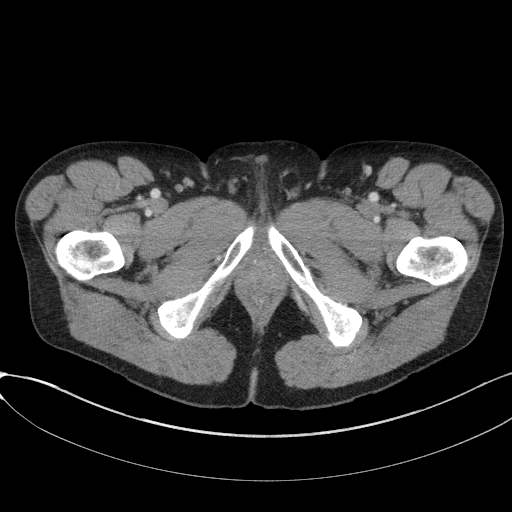
[im 22/103  soft-tissue]
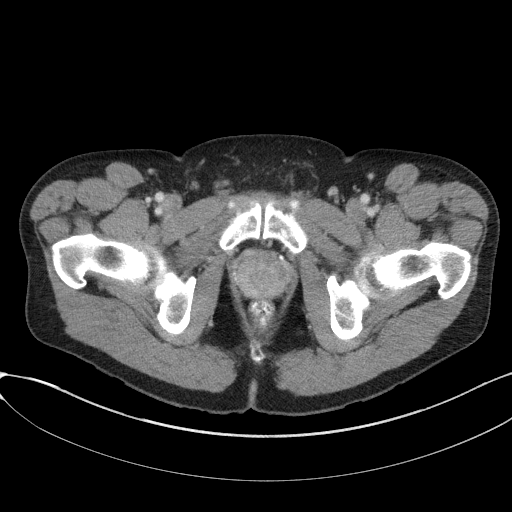
[im 27/103  soft-tissue]
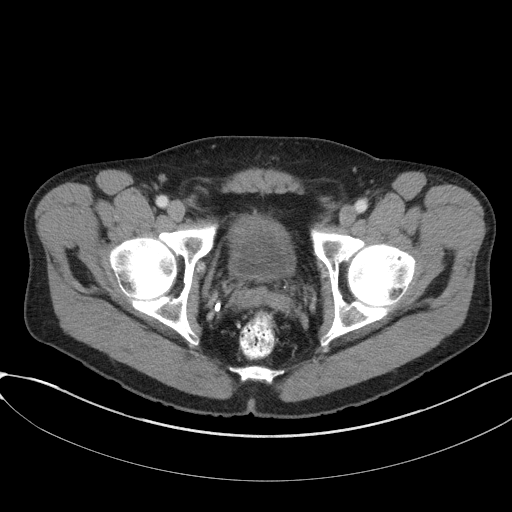
[im 38/103  soft-tissue]
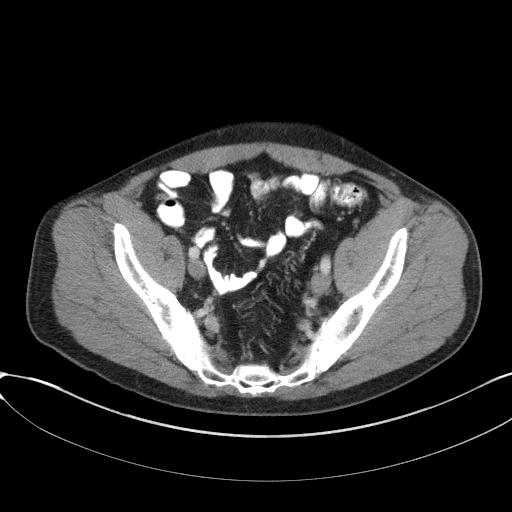
[im 43/103  soft-tissue]
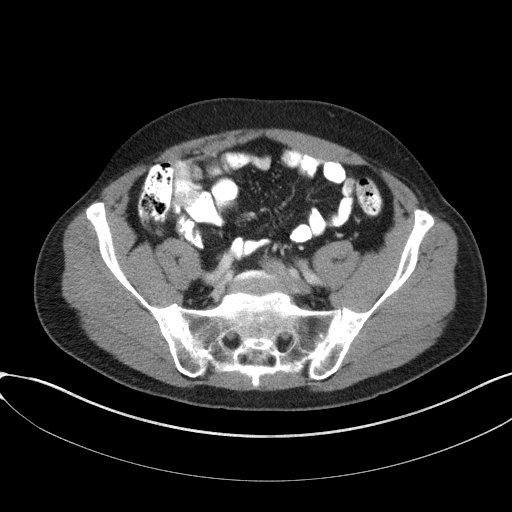
[im 54/103  soft-tissue]
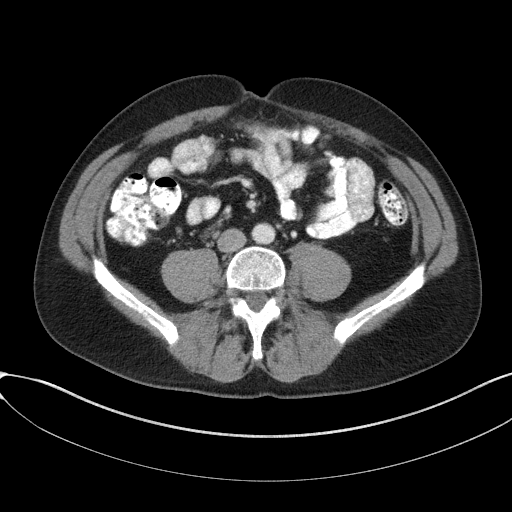
[im 60/103  soft-tissue]
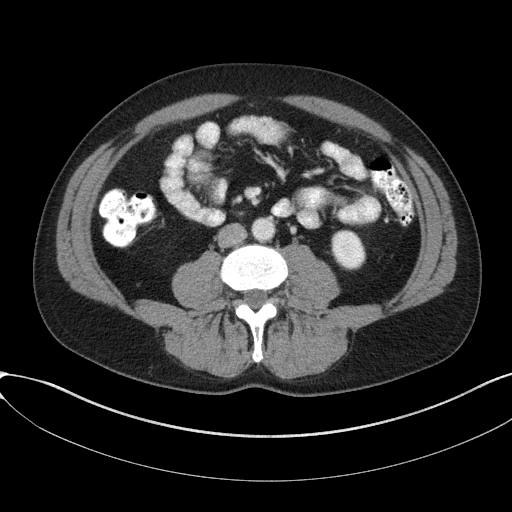
[im 65/103  soft-tissue]
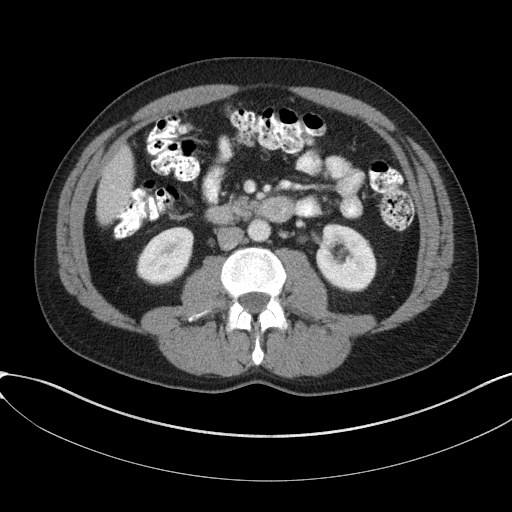
[im 65/103  bone]
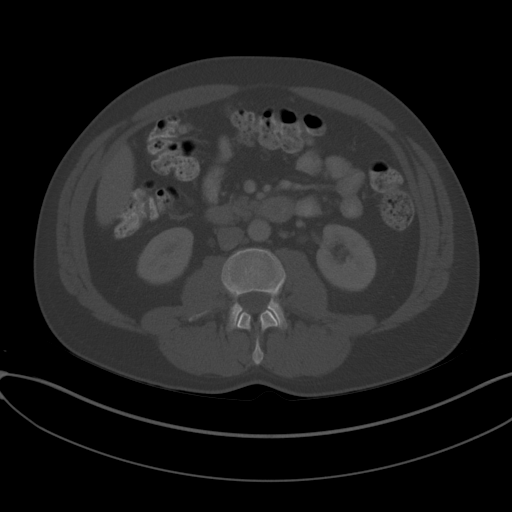
[im 76/103  soft-tissue]
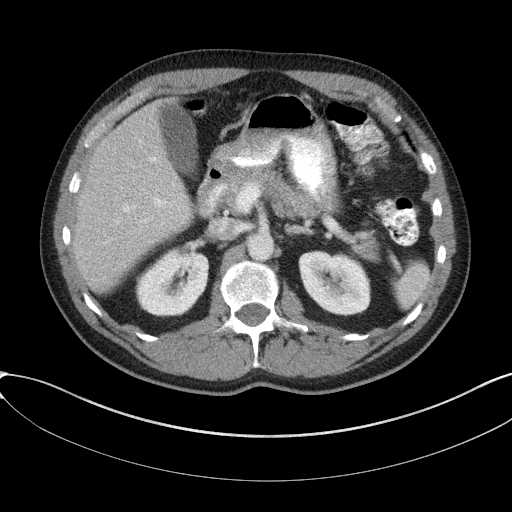
[im 81/103  soft-tissue]
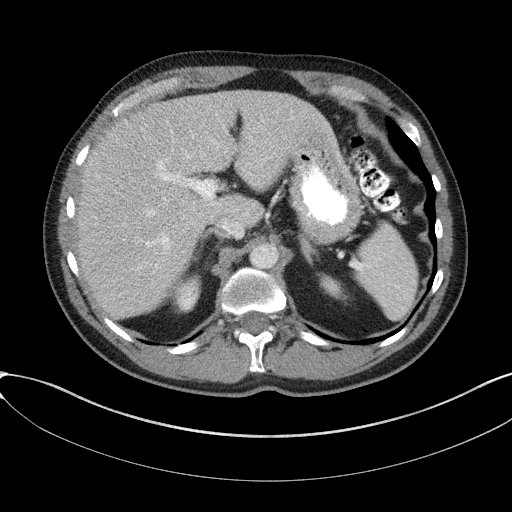
[im 81/103  lung]
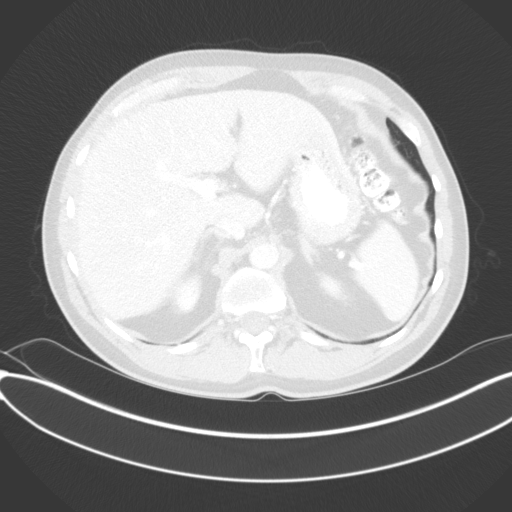
[im 86/103  soft-tissue]
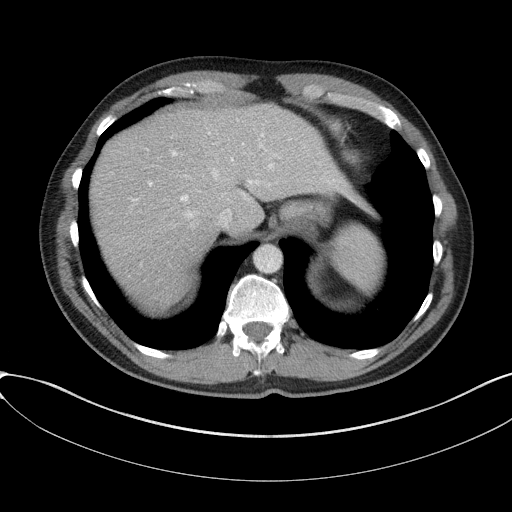
[im 86/103  lung]
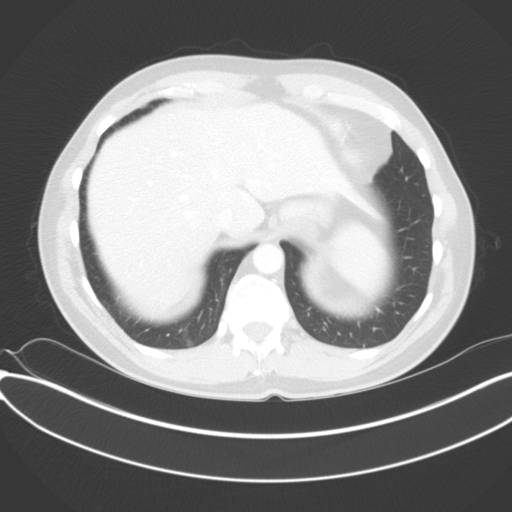
[im 92/103  lung]
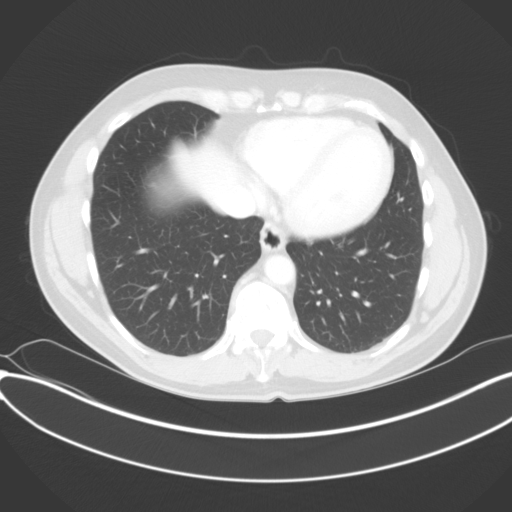
[im 97/103  soft-tissue]
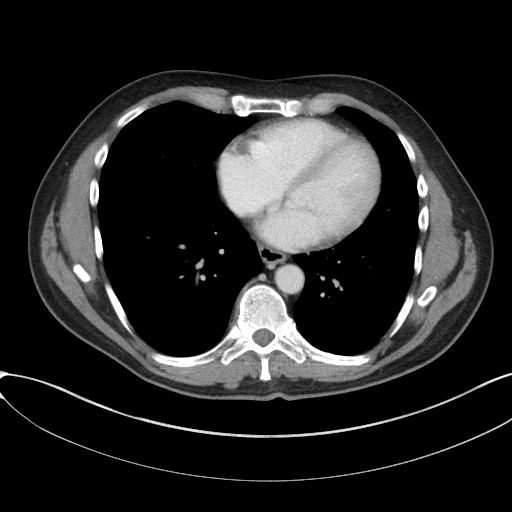
[im 97/103  lung]
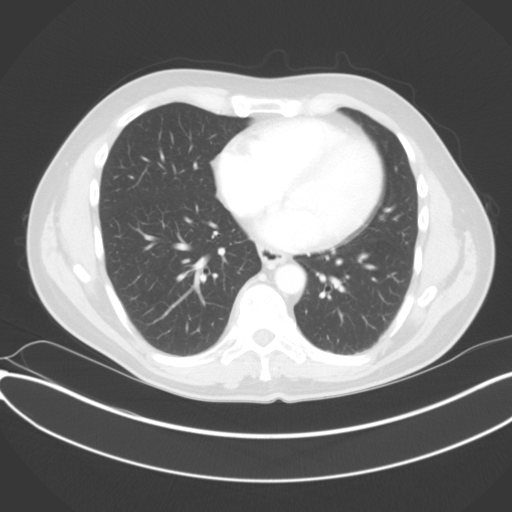

[14 of 32 positions shown; findings below may reference images not displayed]

RADIATION DOSE REDUCTION: This exam was performed according to the
departmental dose-optimization program which includes automated
exposure control, adjustment of the mA and/or kV according to
patient size and/or use of iterative reconstruction technique.

CONTRAST:  100mL [id number] IOPAMIDOL ([id number]-PYY) INJECTION 61%
FINDINGS: Lower chest: No acute abnormality.

Hepatobiliary: Focal fatty deposition adjacent to the falciform
ligament. Gallbladder is unremarkable. No intrahepatic or
extrahepatic biliary ductal dilation.

Pancreas: Unremarkable. No pancreatic ductal dilatation or
surrounding inflammatory changes.

Spleen: Normal in size without focal abnormality.

Adrenals/Urinary Tract: Adrenal glands are unremarkable.
Non-obstructing 3 mm RIGHT-sided nephrolithiasis. No hydronephrosis.
Bladder is decompressed.

Stomach/Bowel: No evidence of bowel obstruction. Predominately
sigmoid diverticulosis without evidence of acute diverticulitis.
Appendix is normal. Stomach is unremarkable.

Vascular/Lymphatic: No significant vascular findings are present. No
enlarged abdominal or pelvic lymph nodes.

Reproductive: Prostate is present.

Other: Fat containing LEFT inguinal hernia. No free air or free
fluid.

Musculoskeletal: Query a RIGHT sacroiliac erosion versus
degenerative changes (series 5, image 111; series 2, image 64.)
IMPRESSION: 1. There is a possible RIGHT sacroiliac joint erosion noted. If
concern for sacroiliitis, this would be better assessed with
dedicated MRI. Recommend correlation with clinical history.
2. Otherwise no CT etiology for RIGHT-sided pain identified.
3. Nonobstructing RIGHT-sided nephrolithiasis.

## 2023-03-08 ENCOUNTER — Encounter (HOSPITAL_BASED_OUTPATIENT_CLINIC_OR_DEPARTMENT_OTHER): Payer: Self-pay | Admitting: Emergency Medicine

## 2023-03-08 ENCOUNTER — Other Ambulatory Visit: Payer: Self-pay

## 2023-03-08 ENCOUNTER — Emergency Department (HOSPITAL_BASED_OUTPATIENT_CLINIC_OR_DEPARTMENT_OTHER)
Admission: EM | Admit: 2023-03-08 | Discharge: 2023-03-08 | Disposition: A | Discharge status: Home / Self Care | Payer: BC Managed Care – PPO | Attending: Emergency Medicine | Admitting: Emergency Medicine

## 2023-03-08 ENCOUNTER — Emergency Department (HOSPITAL_BASED_OUTPATIENT_CLINIC_OR_DEPARTMENT_OTHER): Payer: BC Managed Care – PPO

## 2023-03-08 ENCOUNTER — Other Ambulatory Visit (HOSPITAL_BASED_OUTPATIENT_CLINIC_OR_DEPARTMENT_OTHER): Payer: Self-pay

## 2023-03-08 DIAGNOSIS — Z7982 Long term (current) use of aspirin: Secondary | ICD-10-CM | POA: Diagnosis not present

## 2023-03-08 DIAGNOSIS — K5792 Diverticulitis of intestine, part unspecified, without perforation or abscess without bleeding: Secondary | ICD-10-CM | POA: Diagnosis not present

## 2023-03-08 DIAGNOSIS — K5732 Diverticulitis of large intestine without perforation or abscess without bleeding: Secondary | ICD-10-CM | POA: Diagnosis not present

## 2023-03-08 DIAGNOSIS — K5730 Diverticulosis of large intestine without perforation or abscess without bleeding: Secondary | ICD-10-CM | POA: Diagnosis not present

## 2023-03-08 DIAGNOSIS — R1032 Left lower quadrant pain: Secondary | ICD-10-CM | POA: Diagnosis not present

## 2023-03-08 DIAGNOSIS — N200 Calculus of kidney: Secondary | ICD-10-CM | POA: Diagnosis not present

## 2023-03-08 LAB — URINALYSIS, ROUTINE W REFLEX MICROSCOPIC
Bilirubin Urine: NEGATIVE
Glucose, UA: NEGATIVE mg/dL
Hgb urine dipstick: NEGATIVE
Ketones, ur: NEGATIVE mg/dL
Leukocytes,Ua: NEGATIVE
Nitrite: NEGATIVE
Specific Gravity, Urine: 1.019 (ref 1.005–1.030)
pH: 6.5 (ref 5.0–8.0)

## 2023-03-08 LAB — COMPREHENSIVE METABOLIC PANEL
ALT: 11 U/L (ref 0–44)
AST: 17 U/L (ref 15–41)
Albumin: 4.5 g/dL (ref 3.5–5.0)
Alkaline Phosphatase: 86 U/L (ref 38–126)
Anion gap: 10 (ref 5–15)
BUN: 11 mg/dL (ref 6–20)
CO2: 25 mmol/L (ref 22–32)
Calcium: 9.8 mg/dL (ref 8.9–10.3)
Chloride: 101 mmol/L (ref 98–111)
Creatinine, Ser: 0.98 mg/dL (ref 0.61–1.24)
GFR, Estimated: >60 mL/min (ref >60)
Glucose, Bld: 98 mg/dL (ref 70–99)
Potassium: 4.4 mmol/L (ref 3.5–5.1)
Sodium: 136 mmol/L (ref 135–145)
Total Bilirubin: 0.9 mg/dL (ref 0.3–1.2)
Total Protein: 8 g/dL (ref 6.5–8.1)

## 2023-03-08 LAB — CBC
HCT: 42.5 % (ref 39.0–52.0)
Hemoglobin: 14 g/dL (ref 13.0–17.0)
MCH: 30 pg (ref 26.0–34.0)
MCHC: 32.9 g/dL (ref 30.0–36.0)
MCV: 91.2 fL (ref 80.0–100.0)
Platelets: 216 10*3/uL (ref 150–400)
RBC: 4.66 MIL/uL (ref 4.22–5.81)
RDW: 12.9 % (ref 11.5–15.5)
WBC: 7.7 10*3/uL (ref 4.0–10.5)
nRBC: 0 % (ref 0.0–0.2)

## 2023-03-08 LAB — LIPASE, BLOOD: Lipase: <10 U/L — ABNORMAL LOW (ref 11–51)

## 2023-03-08 MED ORDER — METRONIDAZOLE 500 MG PO TABS
500.0000 mg | ORAL_TABLET | Freq: Two times a day (BID) | ORAL | 0 refills | Status: DC
  Filled 2023-03-08: qty 14, 7d supply, fill #0

## 2023-03-08 MED ORDER — METRONIDAZOLE 500 MG PO TABS
500.0000 mg | ORAL_TABLET | Freq: Once | ORAL | Status: AC
  Administered 2023-03-08: 500 mg via ORAL
  Filled 2023-03-08: qty 1

## 2023-03-08 MED ORDER — AMOXICILLIN-POT CLAVULANATE 875-125 MG PO TABS
1.0000 | ORAL_TABLET | Freq: Once | ORAL | Status: DC
  Filled 2023-03-08: qty 1

## 2023-03-08 MED ORDER — CIPROFLOXACIN HCL 500 MG PO TABS
500.0000 mg | ORAL_TABLET | Freq: Two times a day (BID) | ORAL | 0 refills | Status: DC
  Filled 2023-03-08: qty 14, 7d supply, fill #0

## 2023-03-08 MED ORDER — AMOXICILLIN-POT CLAVULANATE 875-125 MG PO TABS
1.0000 | ORAL_TABLET | Freq: Two times a day (BID) | ORAL | 0 refills | Status: DC
  Filled 2023-03-08: qty 14, 7d supply, fill #0

## 2023-03-08 MED ORDER — IOHEXOL 300 MG/ML  SOLN
100.0000 mL | Freq: Once | INTRAMUSCULAR | Status: AC | PRN
  Administered 2023-03-08: 85 mL via INTRAVENOUS

## 2023-03-08 MED ORDER — CIPROFLOXACIN HCL 500 MG PO TABS
500.0000 mg | ORAL_TABLET | Freq: Once | ORAL | Status: AC
  Administered 2023-03-08: 500 mg via ORAL
  Filled 2023-03-08: qty 1

## 2023-03-08 NOTE — ED Triage Notes (Signed)
Pt arrives to ED with c/o LLQ abdominal pain x3 days.

## 2023-03-08 NOTE — ED Notes (Signed)
All appropriate discharge materials reviewed at length with patient. Time for questions provided. Pt has no other questions at this time and verbalizes understanding of all provided materials.  

## 2023-03-08 NOTE — ED Notes (Signed)
Pt informed this RN that he is allergic to Amoxicillin. Allergies updated. EDP has been notified.

## 2023-03-08 NOTE — ED Provider Notes (Signed)
[place] [facility] AT [facility] Provider Note   CSN: [id number] Arrival date & time: 03/08/23  1034     History  Chief Complaint  Patient presents with   Abdominal Pain    Edwin Gilbert is a 52 y.o. male.  Patient is a 52 year old male with a past medical history of ankylosing spondylitis on meloxicam presenting to the emergency department with abdominal pain.  Patient states for the last few days he has had left lower quadrant abdominal pain radiating across his lower abdomen.  He states that it has been a constant pressure type of pain.  He denies any nausea or vomiting, fevers or chills, diarrhea or constipation, dysuria or hematuria.  He states that he has had diverticulitis in the past and this feels similar.  He denies any prior abdominal surgeries.  The history is provided by the patient and the spouse.  Abdominal Pain      Home Medications Prior to Admission medications   Medication Sig Start Date End Date Taking? Authorizing Provider  amoxicillin-clavulanate (AUGMENTIN) 875-125 MG tablet Take 1 tablet by mouth every 12 (twelve) hours. 03/08/23  Yes Theresia Lo, Benetta Spar K, DO  meloxicam (MOBIC) 15 MG tablet Take 15 mg by mouth daily. 01/01/23  Yes [provider]  aspirin 325 MG tablet Take 325 mg by mouth daily. Taking 1/2 tablet QD    [provider]  celecoxib (CELEBREX) 200 MG capsule Take 200 mg by mouth 2 (two) times daily.    [provider]      Allergies    Lactose intolerance (gi)    Review of Systems   Review of Systems  Gastrointestinal:  Positive for abdominal pain.    Physical Exam Updated Vital Signs BP 128/79 (BP Location: Right Arm)   Pulse 62   Temp 97.7 F (36.5 C) (Temporal)   Resp 16   Ht 5\' 10"  (1.778 m)   Wt 79.4 kg   SpO2 100%   BMI 25.11 kg/m  Physical Exam Vitals and nursing note reviewed.  Constitutional:      General: He is not in acute distress.    Appearance: He is  well-developed.  HENT:     Head: Normocephalic and atraumatic.     Mouth/Throat:     Mouth: Mucous membranes are moist.     Pharynx: Oropharynx is clear.  Eyes:     Extraocular Movements: Extraocular movements intact.  Cardiovascular:     Rate and Rhythm: Normal rate and regular rhythm.     Heart sounds: Normal heart sounds.  Pulmonary:     Effort: Pulmonary effort is normal.     Breath sounds: Normal breath sounds.  Abdominal:     General: Abdomen is flat.     Palpations: Abdomen is soft.     Tenderness: There is abdominal tenderness in the suprapubic area and left lower quadrant. There is no right CVA tenderness or left CVA tenderness.  Skin:    General: Skin is warm and dry.  Neurological:     General: No focal deficit present.     Mental Status: He is alert and oriented to person, place, and time.  Psychiatric:        Mood and Affect: Mood normal.        Behavior: Behavior normal.     ED Results / Procedures / Treatments   Labs (all labs ordered are listed, but only abnormal results are displayed) Labs Reviewed  LIPASE, BLOOD - Abnormal; Notable for the following  components:      Result Value   Lipase <10 (*)    All other components within normal limits  URINALYSIS, ROUTINE W REFLEX MICROSCOPIC - Abnormal; Notable for the following components:   Protein, ur TRACE (*)    All other components within normal limits  COMPREHENSIVE METABOLIC PANEL  CBC    EKG None  Radiology CT Abdomen Pelvis W Contrast  Result Date: 03/08/2023 CLINICAL DATA:  52 year old with left lower quadrant abdominal pain. EXAM: CT ABDOMEN AND PELVIS WITH CONTRAST TECHNIQUE: Multidetector CT imaging of the abdomen and pelvis was performed using the standard protocol following bolus administration of intravenous contrast. RADIATION DOSE REDUCTION: This exam was performed according to the departmental dose-optimization program which includes automated exposure control, adjustment of the mA and/or  kV according to patient size and/or use of iterative reconstruction technique. CONTRAST:  24mL OMNIPAQUE IOHEXOL 300 MG/ML  SOLN COMPARISON:  CT 04/06/2022 FINDINGS: Lower chest: Stable 3 mm subpleural nodule the posterior left lung base on image 11/4 is probably calcified. No acute abnormality at the lung bases. Hepatobiliary: Normal appearance of the liver, gallbladder and portal venous system. Pancreas: Unremarkable. No pancreatic ductal dilatation or surrounding inflammatory changes. Spleen: Normal in size without focal abnormality. Adrenals/Urinary Tract: Normal adrenal glands. Normal appearance of the left kidney without stones or hydronephrosis. Urinary bladder is decompressed. 5 mm stone in the right kidney interpolar region without hydronephrosis. No suspicious renal lesions. Stomach/Bowel: Mild pericolonic inflammation involving a sigmoid colon diverticulum on image 63/2. Finding is suggestive for acute diverticulitis. No evidence for extraluminal gas or adjacent abscess. Normal appendix without inflammatory changes. Normal appearance of small bowel without dilatation or obstruction. Mild wall thickening in the distal stomach is probably related to under distension. Vascular/Lymphatic: No significant vascular findings are present. No enlarged abdominal or pelvic lymph nodes. Reproductive: Prostate is unremarkable. Other: Stranding in the left lower quadrant adjacent to the diverticulitis. No significant free fluid. No clear evidence for free air. Small left inguinal hernia containing fat. Musculoskeletal: No acute bone abnormality. Again noted is sclerosis on both sides of the SI joints. 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 IMPRESSION: 1. Mild pericolonic inflammation involving a sigmoid colon diverticulum. Findings are suggestive for acute diverticulitis. No evidence for an abscess collection or free air. 2. Nonobstructive right renal calculus. 3. Chronic sclerosis  involving the SI joints. Findings could be associated with chronic sacroiliitis versus degenerative changes. Electronically Signed   By: Richarda Overlie M.D.   On: 03/08/2023 12:31    Procedures Procedures    Medications Ordered in ED Medications  amoxicillin-clavulanate (AUGMENTIN) 875-125 MG per tablet 1 tablet (has no administration in time range)  iohexol (OMNIPAQUE) 300 MG/ML solution 100 mL (85 mLs Intravenous Contrast Given 03/08/23 1153)    ED Course/ Medical Decision Making/ A&P Clinical Course as of 03/08/23 1256  Thu Mar 08, 2023  1238 CT with concern for acute diverticulitis. He will be treated with antibiotics and given primary care follow up. [VK]    Clinical Course User Index [VK] Rexford Maus, DO                             Medical Decision Making This patient presents to the ED with chief complaint(s) of abdominal pain with pertinent past medical history of ankylosing spondylitis which further complicates the presenting complaint. The complaint involves an extensive differential diagnosis and also carries with it  a high risk of complications and morbidity.    The differential diagnosis includes diverticulitis, UTI, constipation, nephrolithiasis, pyelonephritis less likely as no CVA tenderness  Additional history obtained: Additional history obtained from family Records reviewed N/A  ED Course and Reassessment: Patient is well-appearing on arrival no acute distress.  He had labs including urine drawn by triage and will additionally have CT abdomen and pelvis to further evaluate for intra-abdominal infection.  He declined any pain medication at this time.  Independent labs interpretation:  The following labs were independently interpreted: Within normal range  Independent visualization of imaging: - I independently visualized the following imaging with scope of interpretation limited to determining acute life threatening conditions related to emergency care: CT  AP, which revealed acute uncomplicated diverticulitis  Consultation: - Consulted or discussed management/test interpretation w/ external professional: N/A  Consideration for admission or further workup: Patient has no emergent conditions requiring admission or further work-up at this time and is stable for discharge home with primary care follow-up  Social Determinants of health: N/A    Amount and/or Complexity of Data Reviewed Labs: ordered. Radiology: ordered.  Risk Prescription drug management.          Final Clinical Impression(s) / ED Diagnoses Final diagnoses:  Diverticulitis    Rx / DC Orders ED Discharge Orders          Ordered    amoxicillin-clavulanate (AUGMENTIN) 875-125 MG tablet  Every 12 hours        03/08/23 1256              Rexford MausKingsley, Krish Bailly K, DO 03/08/23 1256

## 2023-03-08 NOTE — Discharge Instructions (Signed)
You were seen in the emergency department for your abdominal pain.  Your workup did show that you have diverticulitis and this is treated with antibiotics.  You should complete these as prescribed.  You can take Tylenol and Motrin as needed for pain.  You should follow-up with your primary doctor in the next few days to have your symptoms rechecked.  You should return to the emergency department if having fevers despite the antibiotics, significantly worsening pain, repetitive vomiting or if you have any other new or concerning symptoms.

## 2023-03-23 DIAGNOSIS — K5792 Diverticulitis of intestine, part unspecified, without perforation or abscess without bleeding: Secondary | ICD-10-CM | POA: Diagnosis not present

## 2023-04-26 DIAGNOSIS — H209 Unspecified iridocyclitis: Secondary | ICD-10-CM | POA: Diagnosis not present

## 2023-04-26 DIAGNOSIS — M450 Ankylosing spondylitis of multiple sites in spine: Secondary | ICD-10-CM | POA: Diagnosis not present

## 2023-04-26 DIAGNOSIS — Z79899 Other long term (current) drug therapy: Secondary | ICD-10-CM | POA: Diagnosis not present

## 2023-06-27 DIAGNOSIS — R109 Unspecified abdominal pain: Secondary | ICD-10-CM | POA: Diagnosis not present

## 2023-10-23 DIAGNOSIS — M545 Low back pain, unspecified: Secondary | ICD-10-CM | POA: Diagnosis not present

## 2023-11-01 DIAGNOSIS — H40013 Open angle with borderline findings, low risk, bilateral: Secondary | ICD-10-CM | POA: Diagnosis not present

## 2023-11-19 DIAGNOSIS — M546 Pain in thoracic spine: Secondary | ICD-10-CM | POA: Diagnosis not present

## 2023-12-12 DIAGNOSIS — M546 Pain in thoracic spine: Secondary | ICD-10-CM | POA: Diagnosis not present

## 2023-12-17 ENCOUNTER — Ambulatory Visit
Admission: RE | Admit: 2023-12-17 | Discharge: 2023-12-17 | Disposition: A | Discharge status: Home / Self Care | Payer: BC Managed Care – PPO | Source: Ambulatory Visit

## 2023-12-17 ENCOUNTER — Ambulatory Visit
Admission: RE | Admit: 2023-12-17 | Discharge: 2023-12-17 | Disposition: A | Discharge status: Home / Self Care | Payer: BC Managed Care – PPO

## 2023-12-17 ENCOUNTER — Ambulatory Visit (INDEPENDENT_AMBULATORY_CARE_PROVIDER_SITE_OTHER): Payer: BC Managed Care – PPO

## 2023-12-17 ENCOUNTER — Encounter: Payer: Self-pay | Admitting: Emergency Medicine

## 2023-12-17 DIAGNOSIS — Z0000 Encounter for general adult medical examination without abnormal findings: Secondary | ICD-10-CM

## 2023-12-17 DIAGNOSIS — S6000XA Contusion of unspecified finger without damage to nail, initial encounter: Secondary | ICD-10-CM

## 2023-12-17 DIAGNOSIS — M79641 Pain in right hand: Secondary | ICD-10-CM | POA: Diagnosis not present

## 2023-12-17 DIAGNOSIS — S6991XA Unspecified injury of right wrist, hand and finger(s), initial encounter: Secondary | ICD-10-CM | POA: Diagnosis not present

## 2023-12-17 DIAGNOSIS — S60051A Contusion of right little finger without damage to nail, initial encounter: Secondary | ICD-10-CM

## 2023-12-17 DIAGNOSIS — S62306A Unspecified fracture of fifth metacarpal bone, right hand, initial encounter for closed fracture: Secondary | ICD-10-CM

## 2023-12-17 NOTE — ED Triage Notes (Addendum)
Pt reports R hand injury after snowboarding x4 days ago. Reports a fall where he tried to catch himself with the hand. Bruising to anterior hand and R pinkie finger. Pt reports area is especially tender running from his pinkie finger down to his knuckle. Very limited ROM in R pinkie finger. Reduced ROM in other fingers, but not as severe. Slight swelling noted to anterior hand and R pinkie finger. Requesting xray.

## 2023-12-17 NOTE — ED Provider Notes (Signed)
[facility]    CSN: [id number] Arrival date & time: 12/17/23  1152      History   Chief Complaint Chief Complaint  Patient presents with   Hand Injury    HPI Edwin Gilbert is a 53 y.o. male who presents with R hand and small finger pain after falling from snowboarding 3 days ago.     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 Authorizing Provider  meloxicam (MOBIC) 15 MG tablet Take 15 mg by mouth daily. 01/01/23  Yes [provider]    Family History Family History  Problem Relation Age of Onset   Anuerysm Mother    Hypertension Father    Heart disease Father    Heart attack Father    Sleep apnea Brother    Migraines Brother     Social History Social History   Tobacco Use   Smoking status: Never   Smokeless tobacco: Never   Tobacco comments:    occ alcohol  Vaping Use   Vaping status: Never Used  Substance Use Topics   Alcohol use: Yes   Drug use: No     Allergies   Amoxicillin, Lactose, and Lactose intolerance (gi)   Review of Systems Review of Systems As noted in HPI  Physical Exam Triage Vital Signs ED Triage Vitals  Encounter Vitals Group     BP 12/17/23 1244 123/81      Systolic BP Percentile --      Diastolic BP Percentile --      Pulse Rate 12/17/23 1244 87     Resp 12/17/23 1244 16     Temp 12/17/23 1244 98.2 F (36.8 C)     Temp Source 12/17/23 1244 Oral     SpO2 12/17/23 1244 96 %     Weight --      Height --      Head Circumference --      Peak Flow --      Pain Score 12/17/23 1245 2     Pain Loc --      Pain Education --      Exclude from Growth Chart --    No data found.  Updated Vital Signs BP 123/81 (BP Location: Left Arm)   Pulse 87   Temp 98.2 F (36.8 C) (Oral)   Resp 16   SpO2 96%   Visual Acuity Right Eye Distance:   Left Eye Distance:   Bilateral Distance:    Right Eye Near:   Left Eye Near:    Bilateral Near:     Physical Exam Vitals and nursing  note reviewed.  Constitutional:      General: He is not in acute distress.    Appearance: He is not toxic-appearing.  HENT:     Right Ear: External ear normal.  Eyes:     General: No scleral icterus.    Conjunctiva/sclera: Conjunctivae normal.  Pulmonary:     Effort: Pulmonary effort is normal.  Musculoskeletal:     Cervical back: Neck supple.     Comments: R HAND- with mild swelling over 5th knuckle area which is very tender to palpation              R small finger is little swollen with decreased flexion due to pain, but extension is normal. Has point tenderness on Mid joint region.   Skin:    General: Skin is warm and dry.     Findings: Bruising present.     Comments: Bruising noted of mid R small finger area  Neurological:     Mental Status: He is alert and oriented to person, place, and time.     Gait: Gait normal.  Psychiatric:        Mood and Affect: Mood normal.        Behavior: Behavior normal.        Thought Content: Thought content normal.        Judgment: Judgment normal.      UC Treatments / Results  Labs (all labs ordered are listed, but only abnormal results are displayed) Labs Reviewed - No data to display  EKG   Radiology DG Finger  Little Right Result Date: 12/17/2023 CLINICAL DATA:  Right hand injury snowboarding 4 days ago. EXAM: RIGHT LITTLE FINGER 2+V; RIGHT HAND - COMPLETE 3+ VIEW COMPARISON:  None Available. FINDINGS: Subtle cortical irregularity along the ulnar aspect of the fifth metacarpal head, suspicious for tiny nondisplaced fracture. No additional fracture. No dislocation. Prior distal radius ORIF. Joint spaces are preserved. Bone mineralization is normal. Soft tissues are unremarkable. IMPRESSION: 1. Suspected tiny nondisplaced fracture of the fifth metacarpal head. Electronically Signed   By: Obie Dredge M.D.   On: 12/17/2023 14:40   DG Hand Complete Right Result Date: 12/17/2023 CLINICAL DATA:  Right hand injury snowboarding 4 days ago. EXAM: RIGHT LITTLE FINGER 2+V; RIGHT HAND - COMPLETE 3+ VIEW COMPARISON:  None Available. FINDINGS: Subtle cortical irregularity along the ulnar aspect of the fifth metacarpal head, suspicious for tiny nondisplaced fracture. No additional fracture. No dislocation. Prior distal radius ORIF. Joint spaces are preserved. Bone mineralization is normal. Soft tissues are unremarkable. IMPRESSION: 1. Suspected tiny nondisplaced fracture of the fifth metacarpal head. Electronically Signed   By: Obie Dredge M.D.   On: 12/17/2023 14:40    Procedures Procedures (including critical care time)  Medications Ordered in UC Medications - No data to display  Initial Impression / Assessment and Plan / UC Course  I have reviewed the triage vital signs and the nursing notes.  Pertinent  imaging results that were available during my care of the patient were reviewed by me and considered in my medical decision making (see chart for details).   R 5th metacarpal fracture, closed and non displaced  He was placed on a ulna gutter and will FU with his ortho. He declined anything for pain.   Final Clinical Impressions(s) / UC Diagnoses   Final diagnoses:  Pain of right hand  Fingertip  contusion, initial encounter  Closed fracture of fifth metacarpal bone of right hand, unspecified fracture morphology, initial encounter  Discharge Instructions   None    ED Prescriptions   None    I have reviewed the PDMP during this encounter.   Garey Ham, PA-C 12/17/23 1452

## 2023-12-18 DIAGNOSIS — Z03818 Encounter for observation for suspected exposure to other biological agents ruled out: Secondary | ICD-10-CM | POA: Diagnosis not present

## 2023-12-18 DIAGNOSIS — S62306A Unspecified fracture of fifth metacarpal bone, right hand, initial encounter for closed fracture: Secondary | ICD-10-CM | POA: Diagnosis not present

## 2023-12-18 DIAGNOSIS — J029 Acute pharyngitis, unspecified: Secondary | ICD-10-CM | POA: Diagnosis not present

## 2023-12-18 DIAGNOSIS — R21 Rash and other nonspecific skin eruption: Secondary | ICD-10-CM | POA: Diagnosis not present

## 2023-12-18 DIAGNOSIS — J069 Acute upper respiratory infection, unspecified: Secondary | ICD-10-CM | POA: Diagnosis not present

## 2023-12-31 DIAGNOSIS — Z8269 Family history of other diseases of the musculoskeletal system and connective tissue: Secondary | ICD-10-CM | POA: Diagnosis not present

## 2023-12-31 DIAGNOSIS — M546 Pain in thoracic spine: Secondary | ICD-10-CM | POA: Diagnosis not present

## 2024-04-14 DIAGNOSIS — B356 Tinea cruris: Secondary | ICD-10-CM | POA: Diagnosis not present

## 2024-04-28 DIAGNOSIS — M5136 Other intervertebral disc degeneration, lumbar region with discogenic back pain only: Secondary | ICD-10-CM | POA: Diagnosis not present

## 2024-04-28 DIAGNOSIS — Z79899 Other long term (current) drug therapy: Secondary | ICD-10-CM | POA: Diagnosis not present

## 2024-04-28 DIAGNOSIS — H209 Unspecified iridocyclitis: Secondary | ICD-10-CM | POA: Diagnosis not present

## 2024-04-28 DIAGNOSIS — M450 Ankylosing spondylitis of multiple sites in spine: Secondary | ICD-10-CM | POA: Diagnosis not present
